# Patient Record
Sex: Male | Born: 1960
Health system: Southern US, Community
[De-identification: ages and names within clinical notes are randomized; demographics above are authoritative.]

## PROBLEM LIST (undated history)

## (undated) DIAGNOSIS — I10 Essential (primary) hypertension: Secondary | ICD-10-CM

## (undated) DIAGNOSIS — K219 Gastro-esophageal reflux disease without esophagitis: Secondary | ICD-10-CM

## (undated) DIAGNOSIS — C801 Malignant (primary) neoplasm, unspecified: Secondary | ICD-10-CM

## (undated) DIAGNOSIS — J189 Pneumonia, unspecified organism: Secondary | ICD-10-CM

## (undated) DIAGNOSIS — C61 Malignant neoplasm of prostate: Secondary | ICD-10-CM

## (undated) HISTORY — PX: OTHER SURGICAL HISTORY: SHX169

---

## 1998-08-30 ENCOUNTER — Ambulatory Visit (HOSPITAL_COMMUNITY): Admission: RE | Admit: 1998-08-30 | Discharge: 1998-08-30 | Payer: Self-pay | Admitting: Orthopaedic Surgery

## 1999-10-26 ENCOUNTER — Emergency Department (HOSPITAL_COMMUNITY): Admission: EM | Admit: 1999-10-26 | Discharge: 1999-10-26 | Payer: Self-pay | Admitting: *Deleted

## 1999-10-26 ENCOUNTER — Encounter: Payer: Self-pay | Admitting: *Deleted

## 1999-12-25 ENCOUNTER — Encounter: Payer: Self-pay | Admitting: Family Medicine

## 1999-12-25 ENCOUNTER — Encounter: Admission: RE | Admit: 1999-12-25 | Discharge: 1999-12-25 | Payer: Self-pay | Admitting: Family Medicine

## 2000-07-19 ENCOUNTER — Emergency Department (HOSPITAL_COMMUNITY): Admission: EM | Admit: 2000-07-19 | Discharge: 2000-07-19 | Payer: Self-pay | Admitting: Emergency Medicine

## 2004-04-29 ENCOUNTER — Emergency Department (HOSPITAL_COMMUNITY): Admission: EM | Admit: 2004-04-29 | Discharge: 2004-04-29 | Payer: Self-pay | Admitting: Emergency Medicine

## 2006-04-11 ENCOUNTER — Emergency Department (HOSPITAL_COMMUNITY): Admission: EM | Admit: 2006-04-11 | Discharge: 2006-04-11 | Payer: Self-pay | Admitting: Emergency Medicine

## 2007-04-19 ENCOUNTER — Emergency Department (HOSPITAL_COMMUNITY): Admission: EM | Admit: 2007-04-19 | Discharge: 2007-04-19 | Payer: Self-pay | Admitting: Emergency Medicine

## 2011-08-20 ENCOUNTER — Ambulatory Visit: Payer: Medicare HMO

## 2011-08-20 DIAGNOSIS — Z23 Encounter for immunization: Secondary | ICD-10-CM

## 2013-09-21 ENCOUNTER — Ambulatory Visit
Admission: RE | Admit: 2013-09-21 | Discharge: 2013-09-21 | Disposition: A | Discharge status: Home / Self Care | Payer: 59 | Source: Ambulatory Visit | Attending: Family Medicine | Admitting: Family Medicine

## 2013-09-21 ENCOUNTER — Other Ambulatory Visit: Payer: Self-pay | Admitting: Family Medicine

## 2013-09-21 DIAGNOSIS — R059 Cough, unspecified: Secondary | ICD-10-CM

## 2013-09-21 DIAGNOSIS — R05 Cough: Secondary | ICD-10-CM

## 2014-02-09 ENCOUNTER — Emergency Department (HOSPITAL_COMMUNITY)
Admission: EM | Admit: 2014-02-09 | Discharge: 2014-02-10 | Disposition: A | Discharge status: Home / Self Care | Payer: BC Managed Care – PPO | Attending: Emergency Medicine | Admitting: Emergency Medicine

## 2014-02-09 ENCOUNTER — Emergency Department (HOSPITAL_COMMUNITY): Payer: BC Managed Care – PPO

## 2014-02-09 ENCOUNTER — Encounter (HOSPITAL_COMMUNITY): Payer: Self-pay | Admitting: Emergency Medicine

## 2014-02-09 DIAGNOSIS — Z87828 Personal history of other (healed) physical injury and trauma: Secondary | ICD-10-CM | POA: Insufficient documentation

## 2014-02-09 DIAGNOSIS — I10 Essential (primary) hypertension: Secondary | ICD-10-CM | POA: Insufficient documentation

## 2014-02-09 DIAGNOSIS — M25579 Pain in unspecified ankle and joints of unspecified foot: Secondary | ICD-10-CM | POA: Insufficient documentation

## 2014-02-09 DIAGNOSIS — M79674 Pain in right toe(s): Secondary | ICD-10-CM

## 2014-02-09 HISTORY — DX: Essential (primary) hypertension: I10

## 2014-02-09 NOTE — ED Notes (Signed)
Pt to radiology.
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
## 2014-02-09 NOTE — ED Notes (Addendum)
Pt A+Ox4, reports c/o R great toe/joint pain x2 days, reports at onset "felt like a cramp", but reports worsening of pain since.  10/10.  Denies injuries.  Pt reports relieved with rest, worse with ambulation.  However at time also has pain at rest. No redness, swelling noted.  Ambulatory with steady gait.  Skin PWD.  Speaking full/clear sentences.  NAD.

## 2014-02-10 MED ORDER — INDOMETHACIN 25 MG PO CAPS: 25.0000 mg | ORAL_CAPSULE | Freq: Three times a day (TID) | ORAL | Status: DC | PRN

## 2014-02-10 MED ORDER — HYDROCODONE-ACETAMINOPHEN 5-325 MG PO TABS: 1.0000 | ORAL_TABLET | Freq: Four times a day (QID) | ORAL | Status: DC | PRN

## 2014-02-10 NOTE — Discharge Instructions (Signed)
Take Indomethacin as prescribed. Take Norco as needed for severe pain. Follow up with orthopedics.  Gout Gout is an inflammatory arthritis caused by a buildup of uric acid crystals in the joints. Uric acid is a chemical that is normally present in the blood. When the level of uric acid in the blood is too high it can form crystals that deposit in your joints and tissues. This causes joint redness, soreness, and swelling (inflammation). Repeat attacks are common. Over time, uric acid crystals can form into masses (tophi) near a joint, destroying bone and causing disfigurement. Gout is treatable and often preventable. CAUSES  The disease begins with elevated levels of uric acid in the blood. Uric acid is produced by your body when it breaks down a naturally found substance called purines. Certain foods you eat, such as meats and fish, contain high amounts of purines. Causes of an elevated uric acid level include:  Being passed down from parent to child (heredity).  Diseases that cause increased uric acid production (such as obesity, psoriasis, and certain cancers).  Excessive alcohol use.  Diet, especially diets rich in meat and seafood.  Medicines, including certain cancer-fighting medicines (chemotherapy), water pills (diuretics), and aspirin.  Chronic kidney disease. The kidneys are no longer able to remove uric acid well.  Problems with metabolism. Conditions strongly associated with gout include:  Obesity.  High blood pressure.  High cholesterol.  Diabetes. Not everyone with elevated uric acid levels gets gout. It is not understood why some people get gout and others do not. Surgery, joint injury, and eating too much of certain foods are some of the factors that can lead to gout attacks. SYMPTOMS   An attack of gout comes on quickly. It causes intense pain with redness, swelling, and warmth in a joint.  Fever can occur.  Often, only one joint is involved. Certain joints are  more commonly involved:  Base of the big toe.  Knee.  Ankle.  Wrist.  Finger. Without treatment, an attack usually goes away in a few days to weeks. Between attacks, you usually will not have symptoms, which is different from many other forms of arthritis. DIAGNOSIS  Your caregiver will suspect gout based on your symptoms and exam. In some cases, tests may be recommended. The tests may include:  Blood tests.  Urine tests.  X-rays.  Joint fluid exam. This exam requires a needle to remove fluid from the joint (arthrocentesis). Using a microscope, gout is confirmed when uric acid crystals are seen in the joint fluid. TREATMENT  There are two phases to gout treatment: treating the sudden onset (acute) attack and preventing attacks (prophylaxis).  Treatment of an Acute Attack.  Medicines are used. These include anti-inflammatory medicines or steroid medicines.  An injection of steroid medicine into the affected joint is sometimes necessary.  The painful joint is rested. Movement can worsen the arthritis.  You may use warm or cold treatments on painful joints, depending which works best for you.  Treatment to Prevent Attacks.  If you suffer from frequent gout attacks, your caregiver may advise preventive medicine. These medicines are started after the acute attack subsides. These medicines either help your kidneys eliminate uric acid from your body or decrease your uric acid production. You may need to stay on these medicines for a very long time.  The early phase of treatment with preventive medicine can be associated with an increase in acute gout attacks. For this reason, during the first few months of treatment, your caregiver  may also advise you to take medicines usually used for acute gout treatment. Be sure you understand your caregiver's directions. Your caregiver may make several adjustments to your medicine dose before these medicines are effective.  Discuss dietary  treatment with your caregiver or dietitian. Alcohol and drinks high in sugar and fructose and foods such as meat, poultry, and seafood can increase uric acid levels. Your caregiver or dietician can advise you on drinks and foods that should be limited. HOME CARE INSTRUCTIONS   Do not take aspirin to relieve pain. This raises uric acid levels.  Only take over-the-counter or prescription medicines for pain, discomfort, or fever as directed by your caregiver.  Rest the joint as much as possible. When in bed, keep sheets and blankets off painful areas.  Keep the affected joint raised (elevated).  Apply warm or cold treatments to painful joints. Use of warm or cold treatments depends on which works best for you.  Use crutches if the painful joint is in your leg.  Drink enough fluids to keep your urine clear or pale yellow. This helps your body get rid of uric acid. Limit alcohol, sugary drinks, and fructose drinks.  Follow your dietary instructions. Pay careful attention to the amount of protein you eat. Your daily diet should emphasize fruits, vegetables, whole grains, and fat-free or low-fat milk products. Discuss the use of coffee, vitamin C, and cherries with your caregiver or dietician. These may be helpful in lowering uric acid levels.  Maintain a healthy body weight. SEEK MEDICAL CARE IF:   You develop diarrhea, vomiting, or any side effects from medicines.  You do not feel better in 24 hours, or you are getting worse. SEEK IMMEDIATE MEDICAL CARE IF:   Your joint becomes suddenly more tender, and you have chills or a fever. MAKE SURE YOU:   Understand these instructions.  Will watch your condition.  Will get help right away if you are not doing well or get worse. Document Released: 08/16/2000 Document Revised: 12/14/2012 Document Reviewed: 04/01/2012 [facility] Patient Information 2014 [place].

## 2014-02-12 NOTE — ED Provider Notes (Signed)
Medical screening examination/treatment/procedure(s) were performed by non-physician practitioner and as supervising physician I was immediately available for consultation/collaboration.   EKG Interpretation None       Jasper Riling. Alvino Chapel, MD 02/12/14 [phone]

## 2016-09-23 ENCOUNTER — Other Ambulatory Visit: Payer: Self-pay | Admitting: Urology

## 2016-09-23 MED ORDER — MAGNESIUM CITRATE PO SOLN: 1.0000 | Freq: Once | ORAL | Status: AC

## 2016-09-23 MED ORDER — FLEET ENEMA 7-19 GM/118ML RE ENEM: 1.0000 | ENEMA | Freq: Once | RECTAL | Status: AC

## 2016-10-10 NOTE — Patient Instructions (Addendum)
Adam Sampson  10/10/2016   Your procedure is scheduled on: 10/24/16  Report to [facility]  Entrance take [facility]  elevators to 3rd floor to  [place] at    0930 AM.  Call this number if you have problems the morning of surgery [phone]   Remember: ONLY 1 PERSON MAY GO WITH YOU TO SHORT STAY TO GET  READY MORNING OF [place].  Do not eat food or drink liquids :After Midnight.     Take these medicines the morning of surgery with A SIP OF WATER: Nexium and flonase if needed               You may not have any metal on your body including hair pins and              piercings  Do not wear jewelry,lotions, powders or perfumes, deodorant                         Men may shave face and neck.   Do not bring valuables to the hospital. [place].  Contacts, dentures or bridgework may not be worn into surgery.  Leave suitcase in the car. After surgery it may be brought to your room.     Patients discharged the day of surgery will not be allowed to drive home.  Name and phone number of your driver:  Special Instructions: N/A              Please read over the following fact sheets you were given: _____________________________________________________________________             [facility] - Preparing for Surgery Before surgery, you can play an important role.  Because skin is not sterile, your skin needs to be as free of germs as possible.  You can reduce the number of germs on your skin by washing with CHG (chlorahexidine gluconate) soap before surgery.  CHG is an antiseptic cleaner which kills germs and bonds with the skin to continue killing germs even after washing. Please DO NOT use if you have an allergy to CHG or antibacterial soaps.  If your skin becomes reddened/irritated stop using the CHG and inform your nurse when you arrive at Short Stay. Do not shave (including legs and underarms) for at  least 48 hours prior to the first CHG shower.  You may shave your face/neck. Please follow these instructions carefully:  1.  Shower with CHG Soap the night before surgery and the  morning of Surgery.  2.  If you choose to wash your hair, wash your hair first as usual with your  normal  shampoo.  3.  After you shampoo, rinse your hair and body thoroughly to remove the  shampoo.                           4.  Use CHG as you would any other liquid soap.  You can apply chg directly  to the skin and wash                       Gently with a scrungie or clean washcloth.  5.  Apply the CHG Soap to your body  ONLY FROM THE NECK DOWN.   Do not use on face/ open                           Wound or open sores. Avoid contact with eyes, ears mouth and genitals (private parts).                       Wash face,  Genitals (private parts) with your normal soap.             6.  Wash thoroughly, paying special attention to the area where your surgery  will be performed.  7.  Thoroughly rinse your body with warm water from the neck down.  8.  DO NOT shower/wash with your normal soap after using and rinsing off  the CHG Soap.                9.  Pat yourself dry with a clean towel.            10.  Wear clean pajamas.            11.  Place clean sheets on your bed the night of your first shower and do not  sleep with pets. Day of Surgery : Do not apply any lotions/deodorants the morning of surgery.  Please wear clean clothes to the hospital/surgery center.  FAILURE TO FOLLOW THESE INSTRUCTIONS MAY RESULT IN THE CANCELLATION OF YOUR SURGERY PATIENT SIGNATURE_________________________________  NURSE SIGNATURE__________________________________  ________________________________________________________________________  WHAT IS A BLOOD TRANSFUSION? Blood Transfusion Information  A transfusion is the replacement of blood or some of its parts. Blood is made up of multiple cells which provide different functions.  Red  blood cells carry oxygen and are used for blood loss replacement.  White blood cells fight against infection.  Platelets control bleeding.  Plasma helps clot blood.  Other blood products are available for specialized needs, such as hemophilia or other clotting disorders. BEFORE THE TRANSFUSION  Who gives blood for transfusions?   Healthy volunteers who are fully evaluated to make sure their blood is safe. This is blood bank blood. Transfusion therapy is the safest it has ever been in the practice of medicine. Before blood is taken from a donor, a complete history is taken to make sure that person has no history of diseases nor engages in risky social behavior (examples are intravenous drug use or sexual activity with multiple partners). The donor's travel history is screened to minimize risk of transmitting infections, such as malaria. The donated blood is tested for signs of infectious diseases, such as HIV and hepatitis. The blood is then tested to be sure it is compatible with you in order to minimize the chance of a transfusion reaction. If you or a relative donates blood, this is often done in anticipation of surgery and is not appropriate for emergency situations. It takes many days to process the donated blood. RISKS AND COMPLICATIONS Although transfusion therapy is very safe and saves many lives, the main dangers of transfusion include:   Getting an infectious disease.  Developing a transfusion reaction. This is an allergic reaction to something in the blood you were given. Every precaution is taken to prevent this. The decision to have a blood transfusion has been considered carefully by your caregiver before blood is given. Blood is not given unless the benefits outweigh the risks. AFTER THE TRANSFUSION  Right after receiving a blood transfusion, you will usually feel much  better and more energetic. This is especially true if your red blood cells have gotten low (anemic). The  transfusion raises the level of the red blood cells which carry oxygen, and this usually causes an energy increase.  The nurse administering the transfusion will monitor you carefully for complications. HOME CARE INSTRUCTIONS  No special instructions are needed after a transfusion. You may find your energy is better. Speak with your caregiver about any limitations on activity for underlying diseases you may have. SEEK MEDICAL CARE IF:   Your condition is not improving after your transfusion.  You develop redness or irritation at the intravenous (IV) site. SEEK IMMEDIATE MEDICAL CARE IF:  Any of the following symptoms occur over the next 12 hours:  Shaking chills.  You have a temperature by mouth above 102 F (38.9 C), not controlled by medicine.  Chest, back, or muscle pain.  People around you feel you are not acting correctly or are confused.  Shortness of breath or difficulty breathing.  Dizziness and fainting.  You get a rash or develop hives.  You have a decrease in urine output.  Your urine turns a dark color or changes to pink, red, or brown. Any of the following symptoms occur over the next 10 days:  You have a temperature by mouth above 102 F (38.9 C), not controlled by medicine.  Shortness of breath.  Weakness after normal activity.  The white part of the eye turns yellow (jaundice).  You have a decrease in the amount of urine or are urinating less often.  Your urine turns a dark color or changes to pink, red, or brown. Document Released: 08/16/2000 Document Revised: 11/11/2011 Document Reviewed: 04/04/2008 [employer] Patient Information 2014 [place].  _______________________________________________________________________  Incentive Spirometer  An incentive spirometer is a tool that can help keep your lungs clear and active. This tool measures how well you are filling your lungs with each breath. Taking long deep breaths may help reverse or  decrease the chance of developing breathing (pulmonary) problems (especially infection) following:  A long period of time when you are unable to move or be active. BEFORE THE PROCEDURE   If the spirometer includes an indicator to show your best effort, your nurse or respiratory therapist will set it to a desired goal.  If possible, sit up straight or lean slightly forward. Try not to slouch.  Hold the incentive spirometer in an upright position. INSTRUCTIONS FOR USE  1. Sit on the edge of your bed if possible, or sit up as far as you can in bed or on a chair. 2. Hold the incentive spirometer in an upright position. 3. Breathe out normally. 4. Place the mouthpiece in your mouth and seal your lips tightly around it. 5. Breathe in slowly and as deeply as possible, raising the piston or the ball toward the top of the column. 6. Hold your breath for 3-5 seconds or for as long as possible. Allow the piston or ball to fall to the bottom of the column. 7. Remove the mouthpiece from your mouth and breathe out normally. 8. Rest for a few seconds and repeat Steps 1 through 7 at least 10 times every 1-2 hours when you are awake. Take your time and take a few normal breaths between deep breaths. 9. The spirometer may include an indicator to show your best effort. Use the indicator as a goal to work toward during each repetition. 10. After each set of 10 deep breaths, practice coughing to be sure  your lungs are clear. If you have an incision (the cut made at the time of surgery), support your incision when coughing by placing a pillow or rolled up towels firmly against it. Once you are able to get out of bed, walk around indoors and cough well. You may stop using the incentive spirometer when instructed by your caregiver.  RISKS AND COMPLICATIONS  Take your time so you do not get dizzy or light-headed.  If you are in pain, you may need to take or ask for pain medication before doing incentive spirometry.  It is harder to take a deep breath if you are having pain. AFTER USE  Rest and breathe slowly and easily.  It can be helpful to keep track of a log of your progress. Your caregiver can provide you with a simple table to help with this. If you are using the spirometer at home, follow these instructions: [place] IF:   You are having difficultly using the spirometer.  You have trouble using the spirometer as often as instructed.  Your pain medication is not giving enough relief while using the spirometer.  You develop fever of 100.5 F (38.1 C) or higher. SEEK IMMEDIATE MEDICAL CARE IF:   You cough up bloody sputum that had not been present before.  You develop fever of 102 F (38.9 C) or greater.  You develop worsening pain at or near the incision site. MAKE SURE YOU:   Understand these instructions.  Will watch your condition.  Will get help right away if you are not doing well or get worse. Document Released: 12/30/2006 Document Revised: 11/11/2011 Document Reviewed: 03/02/2007 [facility] Patient Information 2014 [place], [place].   ________________________________________________________________________

## 2016-10-14 ENCOUNTER — Encounter (HOSPITAL_COMMUNITY): Payer: Self-pay

## 2016-10-14 ENCOUNTER — Encounter (HOSPITAL_COMMUNITY)
Admission: RE | Admit: 2016-10-14 | Discharge: 2016-10-14 | Disposition: A | Discharge status: Home / Self Care | Payer: BLUE CROSS/BLUE SHIELD | Source: Ambulatory Visit | Attending: Urology | Admitting: Urology

## 2016-10-14 ENCOUNTER — Ambulatory Visit (HOSPITAL_COMMUNITY)
Admission: RE | Admit: 2016-10-14 | Discharge: 2016-10-14 | Disposition: A | Discharge status: Home / Self Care | Payer: BLUE CROSS/BLUE SHIELD | Source: Ambulatory Visit | Attending: Anesthesiology | Admitting: Anesthesiology

## 2016-10-14 DIAGNOSIS — C61 Malignant neoplasm of prostate: Secondary | ICD-10-CM | POA: Insufficient documentation

## 2016-10-14 DIAGNOSIS — Z01812 Encounter for preprocedural laboratory examination: Secondary | ICD-10-CM | POA: Insufficient documentation

## 2016-10-14 DIAGNOSIS — J984 Other disorders of lung: Secondary | ICD-10-CM | POA: Insufficient documentation

## 2016-10-14 DIAGNOSIS — Z01810 Encounter for preprocedural cardiovascular examination: Secondary | ICD-10-CM | POA: Diagnosis not present

## 2016-10-14 DIAGNOSIS — I517 Cardiomegaly: Secondary | ICD-10-CM | POA: Diagnosis not present

## 2016-10-14 DIAGNOSIS — Z01818 Encounter for other preprocedural examination: Secondary | ICD-10-CM

## 2016-10-14 HISTORY — DX: Malignant (primary) neoplasm, unspecified: C80.1

## 2016-10-14 HISTORY — DX: Gastro-esophageal reflux disease without esophagitis: K21.9

## 2016-10-14 HISTORY — DX: Pneumonia, unspecified organism: J18.9

## 2016-10-14 LAB — BASIC METABOLIC PANEL
Anion gap: 7 (ref 5–15)
BUN: 13 mg/dL (ref 6–20)
CALCIUM: 9.8 mg/dL (ref 8.9–10.3)
CHLORIDE: 105 mmol/L (ref 101–111)
CO2: 27 mmol/L (ref 22–32)
CREATININE: 1.21 mg/dL (ref 0.61–1.24)
GFR calc non Af Amer: >60 mL/min (ref >60)
GLUCOSE: 123 mg/dL — AB (ref 65–99)
Potassium: 4.3 mmol/L (ref 3.5–5.1)
Sodium: 139 mmol/L (ref 135–145)

## 2016-10-14 LAB — CBC
HCT: 45.5 % (ref 39.0–52.0)
Hemoglobin: 15.2 g/dL (ref 13.0–17.0)
MCH: 27.8 pg (ref 26.0–34.0)
MCHC: 33.4 g/dL (ref 30.0–36.0)
MCV: 83.2 fL (ref 78.0–100.0)
PLATELETS: 365 10*3/uL (ref 150–400)
RBC: 5.47 MIL/uL (ref 4.22–5.81)
RDW: 14.1 % (ref 11.5–15.5)
WBC: 9.6 10*3/uL (ref 4.0–10.5)

## 2016-10-14 LAB — ABO/RH: ABO/RH(D): A POS

## 2016-10-14 NOTE — Progress Notes (Signed)
Final Ekg  Epic

## 2016-10-23 NOTE — H&P (Signed)
CC/HPI: CC: Prostate Cancer     Adam Sampson is a 56 year old gentleman who was found to have an elevated PSA of 7.17 prompting a TRUS biopsy of the prostate on 07/10/16. This demonstrated Gleason 3+4=7 adenocarcinoma of the prostate with 9 out of 12 biopsy cores positive for malignancy.   Family history: None.   Imaging studies: None.   PMH: He has a history of GERD, gout, and hypertension. Ex-marine. Drives a Teacher, English as a foreign language.  PSH: No abdominal surgeries.   TNM stage: cT1c Nx Mx  PSA: 7.17  Gleason score: 3+4=7  Biopsy (07/10/16): 9/12 cores positive  Left: L lateral apex (50%, 3+3=6), L apex (20%, 3+3=6), L lateral mid (60%, 3+3=6, PNI), L mid (5%, 3+4=7, PNI)  Right: R apex (60%, 3+4=7, PNI), R lateral apex (50%, 3+3=6), R mid (10%, 3+3=6), R lateral mid (50%, 3+3=6), R lateral base (50%, 3+3=6)  Prostate volume: 32.4 cc   Nomogram  OC disease: 27%  EPE: 72%  SVI: 9%  LNI: 7%  PFS (5 year, 10 year): 80%,67%   Urinary function: IPSS is 9. He has urinary frequency and nocturia and attributes this to his hydration.  Erectile function: SHIM score is 24. He does not use medication.     ALLERGIES: None   MEDICATIONS: Nexium 20 mg capsule,delayed release  Amlodipine Besylate 5 mg tablet  Flonase Allergy Relief     Notes: PCP added another BP med on 09/13/16. PT is unsure of medication name. -aj,cma   GU PSH: Circumcision Prostate Needle Biopsy - 07/10/2016 Vasectomy      PSH Notes: rt knee athroscopy   NON-GU PSH: Surgical Pathology, Gross And Microscopic Examination For Prostate Needle - 07/10/2016    GU PMH: Prostate Cancer - 08/07/2016 Elevated PSA - 07/10/2016, - 05/24/2016      PMH Notes: GERD, Gout, hypertension   NON-GU PMH: None   FAMILY HISTORY: Alcoholism - Father ovarian cancer - Mother Seizure - Father    Notes: 2 sons 1 killed in car accident   SOCIAL HISTORY: Marital Status: Single Current Smoking Status: Patient has never smoked.  Has never drank.   Drinks 2 caffeinated drinks per day. Patient's occupation Animator.    REVIEW OF SYSTEMS:    GU Review Male:   Patient reports frequent urination and get up at night to urinate. Patient denies hard to postpone urination, burning/ pain with urination, leakage of urine, stream starts and stops, trouble starting your streams, and have to strain to urinate .  Gastrointestinal (Upper):   Patient denies nausea and vomiting.  Gastrointestinal (Lower):   Patient denies diarrhea and constipation.  Constitutional:   Patient reports fatigue. Patient denies fever, night sweats, and weight loss.  Skin:   Patient denies skin rash/ lesion and itching.  Eyes:   Patient denies blurred vision and double vision.  Ears/ Nose/ Throat:   Patient denies sore throat and sinus problems.  Hematologic/Lymphatic:   Patient denies swollen glands and easy bruising.  Cardiovascular:   Patient denies leg swelling and chest pains.  Respiratory:   Patient denies cough and shortness of breath.  Endocrine:   Patient denies excessive thirst.  Musculoskeletal:   Patient denies back pain and joint pain.  Neurological:   Patient denies headaches and dizziness.  Psychologic:   Patient denies anxiety and depression.   VITAL SIGNS:    Weight 232 lb / 105.23 kg  Height 68 in / 172.72 cm  Pulse 89 /min  BMI 35.3 kg/m  MULTI-SYSTEM PHYSICAL EXAMINATION:    Constitutional: Well-nourished. No physical deformities. Normally developed. Good grooming.  Neck: Neck symmetrical, not swollen. Normal tracheal position.  Respiratory: No labored breathing, no use of accessory muscles. Clear bilaterally.  Cardiovascular: Normal temperature, normal extremity pulses, no swelling, no varicosities. RRR.  Lymphatic: No enlargement of neck, axillae, groin.  Skin: No paleness, no jaundice, no cyanosis. No lesion, no ulcer, no rash.  Neurologic / Psychiatric: Oriented to time, oriented to place, oriented to person. No depression,  no anxiety, no agitation.  Gastrointestinal: No mass, no tenderness, no rigidity, non obese abdomen.  Eyes: Normal conjunctivae. Normal eyelids.  Ears, Nose, Mouth, and Throat: Left ear no scars, no lesions, no masses. Right ear no scars, no lesions, no masses. Nose no scars, no lesions, no masses. Normal hearing. Normal lips.  Musculoskeletal: Normal gait and station of head and neck.       ASSESSMENT:      ICD-10 Details  1 GU:   Prostate Cancer - C61    PLAN:        1. Prostate cancer:  He does wish to proceed with surgical treatment of his prostate cancer and feels well-informed. He will undergo a bilateral nerve sparing robot-assisted laparoscopic radical prostatectomy and bilateral pelvic lymphadenectomy.

## 2016-10-24 ENCOUNTER — Ambulatory Visit (HOSPITAL_COMMUNITY): Payer: BLUE CROSS/BLUE SHIELD | Admitting: Certified Registered"

## 2016-10-24 ENCOUNTER — Encounter (HOSPITAL_COMMUNITY)
Admission: RE | Disposition: A | Discharge status: Home / Self Care | Payer: Self-pay | Source: Ambulatory Visit | Attending: Urology

## 2016-10-24 ENCOUNTER — Observation Stay (HOSPITAL_COMMUNITY)
Admission: RE | Admit: 2016-10-24 | Discharge: 2016-10-25 | Disposition: A | Discharge status: Home / Self Care | Payer: BLUE CROSS/BLUE SHIELD | Source: Ambulatory Visit | Attending: Urology | Admitting: Urology

## 2016-10-24 ENCOUNTER — Encounter (HOSPITAL_COMMUNITY): Payer: Self-pay

## 2016-10-24 DIAGNOSIS — K219 Gastro-esophageal reflux disease without esophagitis: Secondary | ICD-10-CM | POA: Insufficient documentation

## 2016-10-24 DIAGNOSIS — I10 Essential (primary) hypertension: Secondary | ICD-10-CM | POA: Insufficient documentation

## 2016-10-24 DIAGNOSIS — M109 Gout, unspecified: Secondary | ICD-10-CM | POA: Insufficient documentation

## 2016-10-24 DIAGNOSIS — C61 Malignant neoplasm of prostate: Principal | ICD-10-CM | POA: Diagnosis present

## 2016-10-24 HISTORY — PX: LYMPHADENECTOMY: SHX5960

## 2016-10-24 HISTORY — PX: ROBOT ASSISTED LAPAROSCOPIC RADICAL PROSTATECTOMY: SHX5141

## 2016-10-24 LAB — TYPE AND SCREEN
ABO/RH(D): A POS
Antibody Screen: NEGATIVE

## 2016-10-24 LAB — HEMOGLOBIN AND HEMATOCRIT, BLOOD
HEMATOCRIT: 43.6 % (ref 39.0–52.0)
HEMOGLOBIN: 14.8 g/dL (ref 13.0–17.0)

## 2016-10-24 SURGERY — XI ROBOTIC ASSISTED LAPAROSCOPIC RADICAL PROSTATECTOMY LEVEL 2
Anesthesia: General

## 2016-10-24 MED ORDER — DIPHENHYDRAMINE HCL 12.5 MG/5ML PO ELIX: 12.5000 mg | ORAL_SOLUTION | Freq: Four times a day (QID) | ORAL | Status: DC | PRN

## 2016-10-24 MED ORDER — ONDANSETRON HCL 4 MG/2ML IJ SOLN
4.0000 mg | INTRAMUSCULAR | Status: DC | PRN
  Filled 2016-10-24: qty 2

## 2016-10-24 MED ORDER — HYDROMORPHONE HCL 2 MG/ML IJ SOLN
INTRAMUSCULAR | Status: AC
  Filled 2016-10-24: qty 1

## 2016-10-24 MED ORDER — MEPERIDINE HCL 50 MG/ML IJ SOLN: 6.2500 mg | INTRAMUSCULAR | Status: DC | PRN

## 2016-10-24 MED ORDER — DEXAMETHASONE SODIUM PHOSPHATE 10 MG/ML IJ SOLN
INTRAMUSCULAR | Status: AC
  Filled 2016-10-24: qty 1

## 2016-10-24 MED ORDER — FENTANYL CITRATE (PF) 100 MCG/2ML IJ SOLN
INTRAMUSCULAR | Status: DC | PRN
  Administered 2016-10-24: 50 ug via INTRAVENOUS
  Administered 2016-10-24: 100 ug via INTRAVENOUS
  Administered 2016-10-24 (×2): 50 ug via INTRAVENOUS

## 2016-10-24 MED ORDER — SUGAMMADEX SODIUM 500 MG/5ML IV SOLN
INTRAVENOUS | Status: AC
  Filled 2016-10-24: qty 5

## 2016-10-24 MED ORDER — SODIUM CHLORIDE 0.9 % IR SOLN
Status: DC | PRN
  Administered 2016-10-24: 1000 mL

## 2016-10-24 MED ORDER — LACTATED RINGERS IV SOLN
INTRAVENOUS | Status: DC
  Administered 2016-10-24: 1000 mL via INTRAVENOUS
  Administered 2016-10-24 (×2): via INTRAVENOUS

## 2016-10-24 MED ORDER — BUPIVACAINE HCL (PF) 0.25 % IJ SOLN
INTRAMUSCULAR | Status: AC
  Filled 2016-10-24: qty 30

## 2016-10-24 MED ORDER — BUPIVACAINE-EPINEPHRINE 0.25% -1:200000 IJ SOLN
INTRAMUSCULAR | Status: DC | PRN
  Administered 2016-10-24: 30 mL

## 2016-10-24 MED ORDER — PANTOPRAZOLE SODIUM 40 MG PO TBEC
40.0000 mg | DELAYED_RELEASE_TABLET | Freq: Every day | ORAL | Status: DC
  Administered 2016-10-25: 40 mg via ORAL
  Filled 2016-10-24: qty 1

## 2016-10-24 MED ORDER — DEXAMETHASONE SODIUM PHOSPHATE 10 MG/ML IJ SOLN
INTRAMUSCULAR | Status: DC | PRN
  Administered 2016-10-24: 10 mg via INTRAVENOUS

## 2016-10-24 MED ORDER — ONDANSETRON HCL 4 MG/2ML IJ SOLN
INTRAMUSCULAR | Status: AC
  Filled 2016-10-24: qty 2

## 2016-10-24 MED ORDER — KCL IN DEXTROSE-NACL 20-5-0.45 MEQ/L-%-% IV SOLN
INTRAVENOUS | Status: DC
  Administered 2016-10-24 – 2016-10-25 (×2): via INTRAVENOUS
  Filled 2016-10-24 (×3): qty 1000

## 2016-10-24 MED ORDER — DIPHENHYDRAMINE HCL 50 MG/ML IJ SOLN: 12.5000 mg | Freq: Four times a day (QID) | INTRAMUSCULAR | Status: DC | PRN

## 2016-10-24 MED ORDER — PROPOFOL 10 MG/ML IV BOLUS
INTRAVENOUS | Status: DC | PRN
  Administered 2016-10-24: 160 mg via INTRAVENOUS

## 2016-10-24 MED ORDER — ACETAMINOPHEN 325 MG PO TABS: 650.0000 mg | ORAL_TABLET | ORAL | Status: DC | PRN

## 2016-10-24 MED ORDER — PROPOFOL 10 MG/ML IV BOLUS
INTRAVENOUS | Status: AC
  Filled 2016-10-24: qty 20

## 2016-10-24 MED ORDER — HEPARIN SODIUM (PORCINE) 1000 UNIT/ML IJ SOLN
INTRAMUSCULAR | Status: AC
  Filled 2016-10-24: qty 1

## 2016-10-24 MED ORDER — SODIUM CHLORIDE 0.9 % IV BOLUS (SEPSIS)
1000.0000 mL | Freq: Once | INTRAVENOUS | Status: AC
  Administered 2016-10-24: 1000 mL via INTRAVENOUS

## 2016-10-24 MED ORDER — NEBIVOLOL HCL 5 MG PO TABS
5.0000 mg | ORAL_TABLET | Freq: Every day | ORAL | Status: DC
  Administered 2016-10-24: 5 mg via ORAL
  Filled 2016-10-24: qty 1

## 2016-10-24 MED ORDER — LACTATED RINGERS IV SOLN
INTRAVENOUS | Status: DC | PRN
  Administered 2016-10-24: 13:00:00

## 2016-10-24 MED ORDER — ROCURONIUM BROMIDE 10 MG/ML (PF) SYRINGE
PREFILLED_SYRINGE | INTRAVENOUS | Status: DC | PRN
  Administered 2016-10-24: 20 mg via INTRAVENOUS
  Administered 2016-10-24: 50 mg via INTRAVENOUS
  Administered 2016-10-24: 10 mg via INTRAVENOUS

## 2016-10-24 MED ORDER — HYDROMORPHONE HCL 1 MG/ML IJ SOLN
INTRAMUSCULAR | Status: DC | PRN
  Administered 2016-10-24: 0.5 mg via INTRAVENOUS
  Administered 2016-10-24: 1 mg via INTRAVENOUS
  Administered 2016-10-24: 0.5 mg via INTRAVENOUS

## 2016-10-24 MED ORDER — CEFAZOLIN IN D5W 1 GM/50ML IV SOLN
1.0000 g | Freq: Three times a day (TID) | INTRAVENOUS | Status: AC
  Administered 2016-10-24 – 2016-10-25 (×2): 1 g via INTRAVENOUS
  Filled 2016-10-24 (×2): qty 50

## 2016-10-24 MED ORDER — MIDAZOLAM HCL 2 MG/2ML IJ SOLN
INTRAMUSCULAR | Status: AC
  Filled 2016-10-24: qty 2

## 2016-10-24 MED ORDER — FLUTICASONE PROPIONATE 50 MCG/ACT NA SUSP: 1.0000 | Freq: Every day | NASAL | Status: DC | PRN

## 2016-10-24 MED ORDER — SUGAMMADEX SODIUM 200 MG/2ML IV SOLN
INTRAVENOUS | Status: DC | PRN
  Administered 2016-10-24: 150 mg via INTRAVENOUS

## 2016-10-24 MED ORDER — HYDROCODONE-ACETAMINOPHEN 5-325 MG PO TABS: 1.0000 | ORAL_TABLET | Freq: Four times a day (QID) | ORAL | 0 refills | Status: DC | PRN

## 2016-10-24 MED ORDER — ROCURONIUM BROMIDE 50 MG/5ML IV SOSY
PREFILLED_SYRINGE | INTRAVENOUS | Status: AC
  Filled 2016-10-24: qty 5

## 2016-10-24 MED ORDER — KETOROLAC TROMETHAMINE 15 MG/ML IJ SOLN
15.0000 mg | Freq: Four times a day (QID) | INTRAMUSCULAR | Status: DC
  Administered 2016-10-24 – 2016-10-25 (×3): 15 mg via INTRAVENOUS
  Filled 2016-10-24 (×4): qty 1

## 2016-10-24 MED ORDER — MIDAZOLAM HCL 5 MG/5ML IJ SOLN
INTRAMUSCULAR | Status: DC | PRN
  Administered 2016-10-24: 2 mg via INTRAVENOUS

## 2016-10-24 MED ORDER — BUPIVACAINE HCL (PF) 0.5 % IJ SOLN
INTRAMUSCULAR | Status: AC
  Filled 2016-10-24: qty 30

## 2016-10-24 MED ORDER — MORPHINE SULFATE (PF) 4 MG/ML IV SOLN
2.0000 mg | INTRAVENOUS | Status: DC | PRN
  Administered 2016-10-24 – 2016-10-25 (×2): 4 mg via INTRAVENOUS
  Filled 2016-10-24 (×2): qty 1

## 2016-10-24 MED ORDER — FENTANYL CITRATE (PF) 250 MCG/5ML IJ SOLN
INTRAMUSCULAR | Status: AC
  Filled 2016-10-24: qty 5

## 2016-10-24 MED ORDER — LIDOCAINE 2% (20 MG/ML) 5 ML SYRINGE
INTRAMUSCULAR | Status: DC | PRN
  Administered 2016-10-24: 100 mg via INTRAVENOUS

## 2016-10-24 MED ORDER — ONDANSETRON HCL 4 MG/2ML IJ SOLN
INTRAMUSCULAR | Status: DC | PRN
  Administered 2016-10-24: 4 mg via INTRAVENOUS

## 2016-10-24 MED ORDER — HYDROMORPHONE HCL 1 MG/ML IJ SOLN
0.2500 mg | INTRAMUSCULAR | Status: DC | PRN
  Administered 2016-10-24 (×4): 0.5 mg via INTRAVENOUS

## 2016-10-24 MED ORDER — DOCUSATE SODIUM 100 MG PO CAPS
100.0000 mg | ORAL_CAPSULE | Freq: Two times a day (BID) | ORAL | Status: DC
  Administered 2016-10-24 – 2016-10-25 (×2): 100 mg via ORAL
  Filled 2016-10-24 (×2): qty 1

## 2016-10-24 MED ORDER — SULFAMETHOXAZOLE-TRIMETHOPRIM 800-160 MG PO TABS: 1.0000 | ORAL_TABLET | Freq: Two times a day (BID) | ORAL | 0 refills | Status: DC

## 2016-10-24 MED ORDER — LIDOCAINE 2% (20 MG/ML) 5 ML SYRINGE
INTRAMUSCULAR | Status: AC
  Filled 2016-10-24: qty 5

## 2016-10-24 MED ORDER — ONDANSETRON HCL 4 MG/2ML IJ SOLN: 4.0000 mg | Freq: Once | INTRAMUSCULAR | Status: DC | PRN

## 2016-10-24 MED ORDER — CEFAZOLIN SODIUM-DEXTROSE 2-4 GM/100ML-% IV SOLN
2.0000 g | INTRAVENOUS | Status: AC
  Administered 2016-10-24: 2 g via INTRAVENOUS
  Filled 2016-10-24: qty 100

## 2016-10-24 SURGICAL SUPPLY — 55 items
ADH SKN CLS APL DERMABOND .7 (GAUZE/BANDAGES/DRESSINGS) ×2
APPLICATOR COTTON TIP 6IN STRL (MISCELLANEOUS) ×4 IMPLANT
CATH FOLEY 2WAY SLVR 18FR 30CC (CATHETERS) ×4 IMPLANT
CATH ROBINSON RED A/P 16FR (CATHETERS) ×4 IMPLANT
CATH ROBINSON RED A/P 8FR (CATHETERS) ×4 IMPLANT
CATH TIEMANN FOLEY 18FR 5CC (CATHETERS) ×4 IMPLANT
CHLORAPREP W/TINT 26ML (MISCELLANEOUS) ×4 IMPLANT
CLIP LIGATING HEM O LOK PURPLE (MISCELLANEOUS) ×8 IMPLANT
COVER SURGICAL LIGHT HANDLE (MISCELLANEOUS) ×4 IMPLANT
COVER TIP SHEARS 8 DVNC (MISCELLANEOUS) ×2 IMPLANT
COVER TIP SHEARS 8MM DA VINCI (MISCELLANEOUS) ×2
CUTTER ECHEON FLEX ENDO 45 340 (ENDOMECHANICALS) ×4 IMPLANT
DECANTER SPIKE VIAL GLASS SM (MISCELLANEOUS) ×4 IMPLANT
DERMABOND ADVANCED (GAUZE/BANDAGES/DRESSINGS) ×2
DERMABOND ADVANCED .7 DNX12 (GAUZE/BANDAGES/DRESSINGS) ×2 IMPLANT
DRAPE ARM DVNC X/XI (DISPOSABLE) ×8 IMPLANT
DRAPE COLUMN DVNC XI (DISPOSABLE) ×2 IMPLANT
DRAPE DA VINCI XI ARM (DISPOSABLE) ×8
DRAPE DA VINCI XI COLUMN (DISPOSABLE) ×2
DRAPE SURG IRRIG POUCH 19X23 (DRAPES) ×4 IMPLANT
DRSG TEGADERM 4X4.75 (GAUZE/BANDAGES/DRESSINGS) ×4 IMPLANT
ELECT REM PT RETURN 9FT ADLT (ELECTROSURGICAL) ×4
ELECTRODE REM PT RTRN 9FT ADLT (ELECTROSURGICAL) ×2 IMPLANT
GLOVE BIO SURGEON STRL SZ 6.5 (GLOVE) ×3 IMPLANT
GLOVE BIO SURGEONS STRL SZ 6.5 (GLOVE) ×1
GLOVE BIOGEL M STRL SZ7.5 (GLOVE) ×12 IMPLANT
GOWN STRL REUS W/TWL LRG LVL3 (GOWN DISPOSABLE) ×16 IMPLANT
HOLDER FOLEY CATH W/STRAP (MISCELLANEOUS) ×4 IMPLANT
IRRIG SUCT STRYKERFLOW 2 WTIP (MISCELLANEOUS) ×4
IRRIGATION SUCT STRKRFLW 2 WTP (MISCELLANEOUS) ×2 IMPLANT
IV LACTATED RINGERS 1000ML (IV SOLUTION) IMPLANT
NDL SAFETY ECLIPSE 18X1.5 (NEEDLE) ×2 IMPLANT
NEEDLE HYPO 18GX1.5 SHARP (NEEDLE) ×3
PACK ROBOT UROLOGY CUSTOM (CUSTOM PROCEDURE TRAY) ×4 IMPLANT
RELOAD GREEN ECHELON 45 (STAPLE) ×4 IMPLANT
SEAL CANN UNIV 5-8 DVNC XI (MISCELLANEOUS) ×8 IMPLANT
SEAL XI 5MM-8MM UNIVERSAL (MISCELLANEOUS) ×8
SLEEVE SURGEON STRL (DRAPES) ×4 IMPLANT
SOLUTION ELECTROLUBE (MISCELLANEOUS) ×4 IMPLANT
SUT ETHILON 3 0 PS 1 (SUTURE) ×4 IMPLANT
SUT MNCRL 3 0 RB1 (SUTURE) ×2 IMPLANT
SUT MNCRL 3 0 VIOLET RB1 (SUTURE) ×2 IMPLANT
SUT MNCRL AB 4-0 PS2 18 (SUTURE) ×8 IMPLANT
SUT MONOCRYL 3 0 RB1 (SUTURE) ×4
SUT VIC AB 0 CT1 27 (SUTURE) ×4
SUT VIC AB 0 CT1 27XBRD ANTBC (SUTURE) ×2 IMPLANT
SUT VIC AB 0 UR5 27 (SUTURE) ×4 IMPLANT
SUT VIC AB 2-0 SH 27 (SUTURE) ×4
SUT VIC AB 2-0 SH 27X BRD (SUTURE) ×2 IMPLANT
SUT VICRYL 0 UR6 27IN ABS (SUTURE) ×8 IMPLANT
SYR 27GX1/2 1ML LL SAFETY (SYRINGE) ×4 IMPLANT
TOWEL OR 17X26 10 PK STRL BLUE (TOWEL DISPOSABLE) ×4 IMPLANT
TOWEL OR NON WOVEN STRL DISP B (DISPOSABLE) ×4 IMPLANT
TUBING INSUFFLATION 10FT LAP (TUBING) IMPLANT
WATER STERILE IRR 1500ML POUR (IV SOLUTION) IMPLANT

## 2016-10-24 NOTE — Progress Notes (Signed)
Hgb. And Hct. Drawn by lab. 

## 2016-10-24 NOTE — Transfer of Care (Signed)
Immediate Anesthesia Transfer of Care Note  Patient: Adam Sampson  Procedure(s) Performed: Procedure(s): XI ROBOTIC ASSISTED LAPAROSCOPIC RADICAL PROSTATECTOMY LEVEL 2 (N/A) PELVIC LYMPHADENECTOMY (Bilateral)  Patient Location: PACU  Anesthesia Type:General  Level of Consciousness: awake, alert  and oriented  Airway & Oxygen Therapy: Patient Spontanous Breathing and Patient connected to face mask oxygen  Post-op Assessment: Report given to RN and Post -op Vital signs reviewed and stable  Post vital signs: Reviewed and stable  Last Vitals:  Vitals:   10/24/16 0925  BP: (!) 146/86  Pulse: 74  Resp: 16  Temp: 36.9 C    Last Pain:  Vitals:   10/24/16 0925  TempSrc: Oral      Patients Stated Pain Goal: 5 ([id number])  Complications: No apparent anesthesia complications

## 2016-10-24 NOTE — Anesthesia Preprocedure Evaluation (Signed)
Anesthesia Evaluation  Patient identified by MRN, date of birth, ID band Patient awake    Reviewed: Allergy & Precautions, NPO status , Patient's Chart, lab work & pertinent test results  Airway Mallampati: I  TM Distance: >3 FB Neck ROM: Full    Dental   Pulmonary    Pulmonary exam normal       Cardiovascular hypertension, Pt. on medications Normal cardiovascular exam    Neuro/Psych    GI/Hepatic GERD-  Medicated and Controlled,  Endo/Other    Renal/GU      Musculoskeletal   Abdominal   Peds  Hematology   Anesthesia Other Findings   Reproductive/Obstetrics                             Anesthesia Physical Anesthesia Plan  ASA: II  Anesthesia Plan: General   Post-op Pain Management:    Induction: Intravenous  Airway Management Planned: Oral ETT  Additional Equipment:   Intra-op Plan:   Post-operative Plan: Extubation in OR  Informed Consent: I have reviewed the patients History and Physical, chart, labs and discussed the procedure including the risks, benefits and alternatives for the proposed anesthesia with the patient or authorized representative who has indicated his/her understanding and acceptance.     Plan Discussed with: CRNA and Surgeon  Anesthesia Plan Comments:         Anesthesia Quick Evaluation  

## 2016-10-24 NOTE — Anesthesia Postprocedure Evaluation (Signed)
Anesthesia Post Note  Patient: Adam Sampson  Procedure(s) Performed: Procedure(s) (LRB): XI ROBOTIC ASSISTED LAPAROSCOPIC RADICAL PROSTATECTOMY LEVEL 2 (N/A) PELVIC LYMPHADENECTOMY (Bilateral)  Patient location during evaluation: PACU Anesthesia Type: General Level of consciousness: awake and alert Pain management: pain level controlled Vital Signs Assessment: post-procedure vital signs reviewed and stable Respiratory status: spontaneous breathing, nonlabored ventilation, respiratory function stable and patient connected to nasal cannula oxygen Cardiovascular status: blood pressure returned to baseline and stable Postop Assessment: no signs of nausea or vomiting Anesthetic complications: no Comments: Oxygen saturation in high 80's on [place] O2. Will have continuous pulse oximetry overnight       Last Vitals:  Vitals:   10/24/16 1615 10/24/16 1628  BP: (!) 172/89   Pulse: 96 96  Resp: 17 15  Temp:      Last Pain:  Vitals:   10/24/16 1628  TempSrc:   PainSc: 5                  Story Conti S

## 2016-10-24 NOTE — Anesthesia Procedure Notes (Signed)
Procedure Name: Intubation Date/Time: 10/24/2016 12:11 PM Performed by: Noralyn Pick D Pre-anesthesia Checklist: Patient identified, Emergency Drugs available, Suction available and Patient being monitored Patient Re-evaluated:Patient Re-evaluated prior to inductionOxygen Delivery Method: Circle system utilized Preoxygenation: Pre-oxygenation with 100% oxygen Intubation Type: IV induction Ventilation: Mask ventilation without difficulty Laryngoscope Size: Mac and 4 Grade View: Grade II Tube type: Oral Tube size: 7.5 mm Number of attempts: 1 Airway Equipment and Method: Stylet and Oral airway Placement Confirmation: ETT inserted through vocal cords under direct vision,  positive ETCO2 and breath sounds checked- equal and bilateral Secured at: 23 cm Tube secured with: Tape Dental Injury: Teeth and Oropharynx as per pre-operative assessment

## 2016-10-24 NOTE — Progress Notes (Signed)
Hgb.- 14.8- Hct. 43.6- lab results noted

## 2016-10-24 NOTE — Progress Notes (Signed)
Dr. Kalman Shan made aware of patient's blood pressures

## 2016-10-24 NOTE — Discharge Instructions (Signed)

## 2016-10-24 NOTE — Progress Notes (Signed)
Patient ID: Adam Sampson, male   DOB: 01-Mar-1961, 56 y.o.   MRN: [id number]  Post-op note  Subjective: The patient is doing well.  No complaints.  Objective: Vital signs in last 24 hours: Temp:  [98.1 F (36.7 C)-98.8 F (37.1 C)] 98.1 F (36.7 C) (02/22 1745) Pulse Rate:  [74-102] 102 (02/22 1745) Resp:  [12-25] 12 (02/22 1745) BP: (137-180)/(86-100) 167/92 (02/22 1745) SpO2:  [93 %-100 %] 97 % (02/22 1745) Weight:  [109.8 kg (242 lb)] 109.8 kg (242 lb) (02/22 1001)  Intake/Output from previous day: No intake/output data recorded. Intake/Output this shift: Total I/O In: 3110 [I.V.:2110; IV Piggyback:1000] Out: 245 [Urine:100; Drains:45; Blood:100]  Physical Exam:  General: Alert and oriented. Abdomen: Soft, Nondistended. Incisions: Clean and dry. GU: Urine clear.  Lab Results:  Recent Labs  10/24/16 1614  HGB 14.8  HCT 43.6    Assessment/Plan: POD#0   1) Continue to monitor, ambulate, IS   [name]. MD   LOS: 0 days   Arrow Emmerich,LES 10/24/2016, 6:28 PM

## 2016-10-24 NOTE — Op Note (Signed)
Preoperative diagnosis: Clinically localized adenocarcinoma of the prostate (clinical stage cT1c Nx Mx)  Postoperative diagnosis: Clinically localized adenocarcinoma of the prostate (clinical stage cT1c Nx Mx)  Procedure:  1. Robotic assisted laparoscopic radical prostatectomy (bilateral nerve sparing) 2. Bilateral robotic assisted laparoscopic pelvic lymphadenectomy  Surgeon: [name]. M.D.  Assistant:  Debbrah Alar, PA-C  An assistant was required for this surgical procedure.  The duties of the assistant included but were not limited to suctioning, passing suture, camera manipulation, retraction. This procedure would not be able to be performed without an Environmental consultant.  Anesthesia: General  Complications: None  EBL: 100 mL  IVF:  2000 mL crystalloid  Specimens: 1. Prostate and seminal vesicles 2. Right pelvic lymph nodes 3. Left pelvic lymph nodes  Disposition of specimens: Pathology  Drains: 1. 20 Fr coude catheter 2. # 19 Blake pelvic drain  Indication: Adam Sampson is a 56 y.o. year old patient with clinically localized prostate cancer.  After a thorough review of the management options for treatment of prostate cancer, he elected to proceed with surgical therapy and the above procedure(s).  We have discussed the potential benefits and risks of the procedure, side effects of the proposed treatment, the likelihood of the patient achieving the goals of the procedure, and any potential problems that might occur during the procedure or recuperation. Informed consent has been obtained.  Description of procedure:  The patient was taken to the operating room and a general anesthetic was administered. He was given preoperative antibiotics, placed in the dorsal lithotomy position, and prepped and draped in the usual sterile fashion. Next a preoperative timeout was performed. A urethral catheter was placed into the bladder and a site was selected near the umbilicus for  placement of the camera port. This was placed using a standard open Hassan technique which allowed entry into the peritoneal cavity under direct vision and without difficulty. An 8 mm robotic port was placed and a pneumoperitoneum established. The camera was then used to inspect the abdomen and there was no evidence of any intra-abdominal injuries or other abnormalities. The remaining abdominal ports were then placed. 8 mm robotic ports were placed in the right lower quadrant, left lower quadrant, and far left lateral abdominal wall. A 5 mm port was placed in the right upper quadrant and a 12 mm port was placed in the right lateral abdominal wall for laparoscopic assistance. All ports were placed under direct vision without difficulty. The surgical cart was then docked.   Utilizing the cautery scissors, the bladder was reflected posteriorly allowing entry into the space of Retzius and identification of the endopelvic fascia and prostate. The periprostatic fat was then removed from the prostate allowing full exposure of the endopelvic fascia. The endopelvic fascia was then incised from the apex back to the base of the prostate bilaterally and the underlying levator muscle fibers were swept laterally off the prostate thereby isolating the dorsal venous complex. The dorsal vein was then stapled and divided with a 45 mm Flex Echelon stapler. Attention then turned to the bladder neck which was divided anteriorly thereby allowing entry into the bladder and exposure of the urethral catheter. The catheter balloon was deflated and the catheter was brought into the operative field and used to retract the prostate anteriorly. The posterior bladder neck was then examined and was divided allowing further dissection between the bladder and prostate posteriorly until the vasa deferentia and seminal vessels were identified. The vasa deferentia were isolated, divided, and  lifted anteriorly. The seminal vesicles were dissected down  to their tips with care to control the seminal vascular arterial blood supply. These structures were then lifted anteriorly and the space between Denonvillier's fascia and the anterior rectum was developed with a combination of sharp and blunt dissection. This isolated the vascular pedicles of the prostate.  The lateral prostatic fascia was then sharply incised allowing release of the neurovascular bundles bilaterally. The vascular pedicles of the prostate were then ligated with Weck clips between the prostate and neurovascular bundles and divided with sharp cold scissor dissection resulting in neurovascular bundle preservation. The neurovascular bundles were then separated off the apex of the prostate and urethra bilaterally.  The urethra was then sharply transected allowing the prostate specimen to be disarticulated. The pelvis was copiously irrigated and hemostasis was ensured. There was no evidence for rectal injury.  Attention then turned to the right pelvic sidewall. The fibrofatty tissue between the external iliac vein, confluence of the iliac vessels, hypogastric artery, and Cooper's ligament was dissected free from the pelvic sidewall with care to preserve the obturator nerve. Weck clips were used for lymphostasis and hemostasis. An identical procedure was performed on the contralateral side and the lymphatic packets were removed for permanent pathologic analysis.  Attention then turned to the urethral anastomosis. A 2-0 Vicryl slip knot was placed between Denonvillier's fascia, the posterior bladder neck, and the posterior urethra to reapproximate these structures. A double-armed 3-0 Monocryl suture was then used to perform a 360 running tension-free anastomosis between the bladder neck and urethra. A new urethral catheter was then placed into the bladder and irrigated. There were no blood clots within the bladder and the anastomosis appeared to be watertight. A #19 Blake drain was then brought  through the left lateral 8 mm port site and positioned appropriately within the pelvis. It was secured to the skin with a nylon suture. The surgical cart was then undocked. The right lateral 12 mm port site was closed at the fascial level with a 0 Vicryl suture placed laparoscopically. All remaining ports were then removed under direct vision. The prostate specimen was removed intact within the Endopouch retrieval bag via the periumbilical camera port site. This fascial opening was closed with two running 0 Vicryl sutures. 0.25% Marcaine was then injected into all port sites and all incisions were reapproximated at the skin level with 4-0 Monocryl subcuticular sutures and Liquiband. The patient appeared to tolerate the procedure well and without complications. The patient was able to be extubated and transferred to the recovery unit in satisfactory condition.   [name] MD

## 2016-10-24 NOTE — Progress Notes (Signed)
Pt. States used mag citrate and fleets enema at home as instructed with good results.

## 2016-10-25 ENCOUNTER — Encounter (HOSPITAL_COMMUNITY): Payer: Self-pay | Admitting: *Deleted

## 2016-10-25 DIAGNOSIS — C61 Malignant neoplasm of prostate: Secondary | ICD-10-CM | POA: Diagnosis not present

## 2016-10-25 LAB — HEMOGLOBIN AND HEMATOCRIT, BLOOD
HCT: 39.2 % (ref 39.0–52.0)
Hemoglobin: 13.5 g/dL (ref 13.0–17.0)

## 2016-10-25 MED ORDER — BISACODYL 10 MG RE SUPP
10.0000 mg | Freq: Once | RECTAL | Status: AC
  Administered 2016-10-25: 10 mg via RECTAL
  Filled 2016-10-25: qty 1

## 2016-10-25 MED ORDER — HYDROCODONE-ACETAMINOPHEN 5-325 MG PO TABS
1.0000 | ORAL_TABLET | ORAL | Status: DC | PRN
  Administered 2016-10-25: 1 via ORAL
  Filled 2016-10-25: qty 1

## 2016-10-25 MED FILL — HYDROCODON-APAP 5-325: 5-325 | 3 days supply | Qty: 30 | Fill #0

## 2016-10-25 MED FILL — SULFAMETHOXAZOLE/TMP DS TAB: 800-160 | 3 days supply | Qty: 6 | Fill #0

## 2016-10-25 NOTE — Discharge Summary (Signed)
Date of admission: 10/24/2016  Date of discharge: 10/25/2016  Admission diagnosis: Prostate Cancer  Discharge diagnosis: Prostate Cancer  History and Physical: For full details, please see admission history and physical. Briefly, Adam Sampson is a 56 y.o. gentleman with localized prostate cancer.  After discussing management/treatment options, he elected to proceed with surgical treatment.  Hospital Course: MIRO BALDERSON was taken to the operating room on 10/24/2016 and underwent a robotic assisted laparoscopic radical prostatectomy. He tolerated this procedure well and without complications. Postoperatively, he was able to be transferred to a regular hospital room following recovery from anesthesia.  He was able to begin ambulating the night of surgery. He remained hemodynamically stable overnight.  He had excellent urine output with appropriately minimal output from his pelvic drain and his pelvic drain was removed on POD #1.  He was transitioned to oral pain medication, tolerated a clear liquid diet, and had met all discharge criteria and was able to be discharged home later on POD#1.  Laboratory values:  Recent Labs  10/24/16 1614 10/25/16 0533  HGB 14.8 13.5  HCT 43.6 39.2    Disposition: Home  Discharge instruction: He was instructed to be ambulatory but to refrain from heavy lifting, strenuous activity, or driving. He was instructed on urethral catheter care.  Discharge medications:  Allergies as of 10/25/2016   No Known Allergies     Medication List    STOP taking these medications   ALEVE 220 MG tablet Generic drug:  naproxen sodium     TAKE these medications   amLODipine 5 MG tablet Commonly known as:  NORVASC Take 1 tablet by mouth at bedtime.   esomeprazole 20 MG capsule Commonly known as:  NEXIUM Take 20 mg by mouth daily.   fluticasone 50 MCG/ACT nasal spray Commonly known as:  FLONASE Place 1 spray into both nostrils daily as needed for allergies or  rhinitis.   HYDROcodone-acetaminophen 5-325 MG tablet Commonly known as:  NORCO Take 1-2 tablets by mouth every 6 (six) hours as needed for moderate pain or severe pain.   nebivolol 5 MG tablet Commonly known as:  BYSTOLIC Take 5 mg by mouth at bedtime.   sulfamethoxazole-trimethoprim 800-160 MG tablet Commonly known as:  BACTRIM DS,SEPTRA DS Take 1 tablet by mouth 2 (two) times daily. Start the day prior to foley removal appointment       Followup: He will followup in 1 week for catheter removal and to discuss his surgical pathology results.

## 2016-10-25 NOTE — Addendum Note (Signed)
Addendum  created 10/25/16 1432 by Marijo Conception, CRNA   Anesthesia Intra Meds edited

## 2016-10-25 NOTE — Progress Notes (Signed)
1 Day Post-Op Subjective: The patient is doing well.  No nausea or vomiting. Pain is adequately controlled. Minimal drain output (38ml over last 5 hours). Ambulated x 2, no flatus yet. Labs appropriate.  Objective: Vital signs in last 24 hours: Temp:  [98.1 F (36.7 C)-99.3 F (37.4 C)] 99.2 F (37.3 C) (02/23 0652) Pulse Rate:  [74-109] 89 (02/23 0652) Resp:  [12-25] 18 (02/23 0652) BP: (137-180)/(78-100) 141/83 (02/23 0652) SpO2:  [92 %-100 %] 95 % (02/23 0652) Weight:  [109.8 kg (242 lb)] 109.8 kg (242 lb) (02/22 1001)  Intake/Output from previous day: 02/22 0701 - 02/23 0700 In: 5422.5 [P.O.:600; I.V.:3772.5; IV Piggyback:1050] Out: 2300 [Urine:2100; Drains:100; Blood:100] Intake/Output this shift: No intake/output data recorded.  Physical Exam:  General: Alert and oriented. CV: RRR Lungs: Clear bilaterally. GI: Soft, Nondistended. Incisions: Clean, dry, and intact Urine: Barely blood tinged in tubing Extremities: Nontender, no erythema, no edema.  Lab Results:  Recent Labs  10/24/16 1614 10/25/16 0533  HGB 14.8 13.5  HCT 43.6 39.2      Assessment/Plan: POD# 1 s/p robotic prostatectomy.  1) SL IVF 2) Ambulate, Incentive spirometry 3) Transition to oral pain medication 4) Dulcolax suppository 5) D/C pelvic drain 6) Plan for likely discharge later today     LOS: 0 days   Adam Sampson 10/25/2016, 7:03 AM

## 2016-10-29 ENCOUNTER — Encounter (HOSPITAL_COMMUNITY): Payer: Self-pay | Admitting: Urology

## 2017-02-03 NOTE — Addendum Note (Signed)
Addendum  created 02/03/17 1126 by Myrtie Soman, MD   Sign clinical note

## 2017-02-03 NOTE — Anesthesia Postprocedure Evaluation (Signed)
Anesthesia Post Note  Patient: Adam Sampson  Procedure(s) Performed: Procedure(s) (LRB): XI ROBOTIC ASSISTED LAPAROSCOPIC RADICAL PROSTATECTOMY LEVEL 2 (N/A) PELVIC LYMPHADENECTOMY (Bilateral)     Anesthesia Post Evaluation  Last Vitals:  Vitals:   10/25/16 0019 10/25/16 0652  BP: (!) 156/78 (!) 141/83  Pulse: (!) 106 89  Resp: 16 18  Temp: 37.4 C 37.3 C    Last Pain:  Vitals:   10/25/16 0920  TempSrc:   PainSc: Asleep                 Melessia Kaus S

## 2019-04-01 ENCOUNTER — Other Ambulatory Visit: Payer: Self-pay | Admitting: Family Medicine

## 2019-04-01 ENCOUNTER — Ambulatory Visit
Admission: RE | Admit: 2019-04-01 | Discharge: 2019-04-01 | Disposition: A | Discharge status: Home / Self Care | Payer: BLUE CROSS/BLUE SHIELD | Source: Ambulatory Visit | Attending: Family Medicine | Admitting: Family Medicine

## 2019-04-01 DIAGNOSIS — M545 Low back pain, unspecified: Secondary | ICD-10-CM

## 2020-06-11 ENCOUNTER — Encounter (HOSPITAL_COMMUNITY): Payer: Self-pay

## 2020-06-11 ENCOUNTER — Other Ambulatory Visit: Payer: Self-pay

## 2020-06-11 ENCOUNTER — Emergency Department (HOSPITAL_COMMUNITY)
Admission: EM | Admit: 2020-06-11 | Discharge: 2020-06-11 | Disposition: A | Discharge status: Home / Self Care | Payer: BC Managed Care – PPO | Attending: Emergency Medicine | Admitting: Emergency Medicine

## 2020-06-11 ENCOUNTER — Emergency Department (HOSPITAL_COMMUNITY): Payer: BC Managed Care – PPO

## 2020-06-11 DIAGNOSIS — Z87891 Personal history of nicotine dependence: Secondary | ICD-10-CM | POA: Insufficient documentation

## 2020-06-11 DIAGNOSIS — Z8546 Personal history of malignant neoplasm of prostate: Secondary | ICD-10-CM | POA: Insufficient documentation

## 2020-06-11 DIAGNOSIS — R0602 Shortness of breath: Secondary | ICD-10-CM | POA: Diagnosis present

## 2020-06-11 DIAGNOSIS — Z20822 Contact with and (suspected) exposure to covid-19: Secondary | ICD-10-CM | POA: Diagnosis not present

## 2020-06-11 DIAGNOSIS — I10 Essential (primary) hypertension: Secondary | ICD-10-CM | POA: Insufficient documentation

## 2020-06-11 DIAGNOSIS — Z79899 Other long term (current) drug therapy: Secondary | ICD-10-CM | POA: Insufficient documentation

## 2020-06-11 DIAGNOSIS — J40 Bronchitis, not specified as acute or chronic: Secondary | ICD-10-CM

## 2020-06-11 LAB — BASIC METABOLIC PANEL
Anion gap: 12 (ref 5–15)
BUN: 16 mg/dL (ref 6–20)
CO2: 24 mmol/L (ref 22–32)
Calcium: 9.6 mg/dL (ref 8.9–10.3)
Chloride: 104 mmol/L (ref 98–111)
Creatinine, Ser: 1.19 mg/dL (ref 0.61–1.24)
GFR, Estimated: >60 mL/min (ref >60)
Glucose, Bld: 163 mg/dL — ABNORMAL HIGH (ref 70–99)
Potassium: 3.8 mmol/L (ref 3.5–5.1)
Sodium: 140 mmol/L (ref 135–145)

## 2020-06-11 LAB — RESPIRATORY PANEL BY RT PCR (FLU A&B, COVID)
Influenza A by PCR: NEGATIVE
Influenza B by PCR: NEGATIVE
SARS Coronavirus 2 by RT PCR: NEGATIVE

## 2020-06-11 LAB — CBC
HCT: 45 % (ref 39.0–52.0)
Hemoglobin: 14.6 g/dL (ref 13.0–17.0)
MCH: 27.6 pg (ref 26.0–34.0)
MCHC: 32.4 g/dL (ref 30.0–36.0)
MCV: 85.1 fL (ref 80.0–100.0)
Platelets: 371 10*3/uL (ref 150–400)
RBC: 5.29 MIL/uL (ref 4.22–5.81)
RDW: 14.5 % (ref 11.5–15.5)
WBC: 12.2 10*3/uL — ABNORMAL HIGH (ref 4.0–10.5)
nRBC: 0 % (ref 0.0–0.2)

## 2020-06-11 LAB — TROPONIN I (HIGH SENSITIVITY)
Troponin I (High Sensitivity): 7 ng/L (ref <18)
Troponin I (High Sensitivity): 7 ng/L (ref <18)

## 2020-06-11 LAB — D-DIMER, QUANTITATIVE: D-Dimer, Quant: 0.31 ug/mL-FEU (ref 0.00–0.50)

## 2020-06-11 MED ORDER — DOXYCYCLINE HYCLATE 100 MG PO CAPS: 100.0000 mg | ORAL_CAPSULE | Freq: Two times a day (BID) | ORAL | 0 refills | Status: DC

## 2020-06-11 MED ORDER — HYDROCODONE-ACETAMINOPHEN 5-325 MG PO TABS
1.0000 | ORAL_TABLET | Freq: Once | ORAL | Status: AC
  Administered 2020-06-11: 1 via ORAL
  Filled 2020-06-11: qty 1

## 2020-06-11 NOTE — Discharge Instructions (Addendum)
Follow-up with your regular doctor this week for recheck return if problem

## 2020-06-11 NOTE — ED Provider Notes (Signed)
[facility] DEPT Provider Note   CSN: [id number] Arrival date & time: 06/11/20  0109     History Chief Complaint  Patient presents with   Shortness of Breath    Adam Sampson is a 59 y.o. male.  Complains of mild cough and some chest pain with inspiration no fevers no chills  The history is provided by the patient and medical records. No language interpreter was used.  Shortness of Breath Severity:  Mild Onset quality:  Sudden Timing:  Constant Progression:  Waxing and waning Chronicity:  New Context: not activity   Relieved by:  Nothing Worsened by:  Nothing Associated symptoms: no abdominal pain, no chest pain, no cough, no headaches and no rash        Past Medical History:  Diagnosis Date   Cancer ([place])    prostate   GERD (gastroesophageal reflux disease)    Hypertension    Pneumonia    hx of as a child    Patient Active Problem List   Diagnosis Date Noted   Prostate cancer ([place]) 10/24/2016    Past Surgical History:  Procedure Laterality Date   LYMPHADENECTOMY Bilateral 10/24/2016   Procedure: PELVIC LYMPHADENECTOMY;  Surgeon: Raynelle Bring, MD;  Location: [facility];  Service: Urology;  Laterality: Bilateral;   Right knee arthroscopy     ROBOT ASSISTED LAPAROSCOPIC RADICAL PROSTATECTOMY N/A 10/24/2016   Procedure: XI ROBOTIC ASSISTED LAPAROSCOPIC RADICAL PROSTATECTOMY LEVEL 2;  Surgeon: Raynelle Bring, MD;  Location: [facility];  Service: Urology;  Laterality: N/A;   Vascectomy     1998       History reviewed. No pertinent family history.  Social History   Tobacco Use   Smoking status: Never Smoker   Smokeless tobacco: Former Network engineer Use Topics   Alcohol use: No   Drug use: No    Home Medications Prior to Admission medications   Medication Sig Start Date End Date Taking? Authorizing Provider  amLODipine (NORVASC) 10 MG tablet Take 10 mg by mouth daily. 05/19/20  Yes [provider]   BIOTIN PO Take 1 tablet by mouth daily.   Yes [provider]  carvedilol (COREG) 12.5 MG tablet Take 12.5 mg by mouth 2 (two) times daily. 05/19/20  Yes [provider]  fluticasone (FLONASE) 50 MCG/ACT nasal spray Place 1 spray into both nostrils daily as needed for allergies or rhinitis.   Yes [provider]  naproxen sodium (ALEVE) 220 MG tablet Take 440 mg by mouth 2 (two) times daily as needed (headache/pain).   Yes [provider]  pantoprazole (PROTONIX) 40 MG tablet Take 40 mg by mouth daily. 05/19/20  Yes [provider]  rosuvastatin (CRESTOR) 10 MG tablet Take 10 mg by mouth daily. 05/19/20  Yes [provider]  VITAMIN E PO Take 1 tablet by mouth daily.   Yes [provider]  doxycycline (VIBRAMYCIN) 100 MG capsule Take 1 capsule (100 mg total) by mouth 2 (two) times daily. One po bid x 7 days 06/11/20   Milton Ferguson, MD  HYDROcodone-acetaminophen [facility]) 5-325 MG tablet Take 1-2 tablets by mouth every 6 (six) hours as needed for moderate pain or severe pain. Patient not taking: Reported on 06/11/2020 10/24/16   Debbrah Alar, PA-C  sulfamethoxazole-trimethoprim (BACTRIM DS,SEPTRA DS) 800-160 MG tablet Take 1 tablet by mouth 2 (two) times daily. Start the day prior to foley removal appointment Patient not taking: Reported on 06/11/2020 10/24/16   Debbrah Alar, PA-C  Allergies    Patient has no known allergies.  Review of Systems   Review of Systems  Constitutional: Negative for appetite change and fatigue.  HENT: Negative for congestion, ear discharge and sinus pressure.   Eyes: Negative for discharge.  Respiratory: Positive for shortness of breath. Negative for cough.   Cardiovascular: Negative for chest pain.  Gastrointestinal: Negative for abdominal pain and diarrhea.  Genitourinary: Negative for frequency and hematuria.  Musculoskeletal: Negative for back pain.  Skin: Negative for rash.  Neurological:  Negative for seizures and headaches.  Psychiatric/Behavioral: Negative for hallucinations.    Physical Exam Updated Vital Signs BP (!) 162/94 (BP Location: Right Arm)    Pulse 74    Temp 98 F (36.7 C) (Oral)    Resp 18    Ht 5' 9.5" (1.765 m)    Wt 113.4 kg    SpO2 96%    BMI 36.39 kg/m   Physical Exam Vitals and nursing note reviewed.  Constitutional:      Appearance: He is well-developed.  HENT:     Head: Normocephalic.     Nose: Nose normal.  Eyes:     General: No scleral icterus.    Conjunctiva/sclera: Conjunctivae normal.  Neck:     Thyroid: No thyromegaly.  Cardiovascular:     Rate and Rhythm: Normal rate and regular rhythm.     Heart sounds: No murmur heard.  No friction rub. No gallop.   Pulmonary:     Breath sounds: No stridor. No wheezing or rales.  Chest:     Chest wall: No tenderness.  Abdominal:     General: There is no distension.     Tenderness: There is no abdominal tenderness. There is no rebound.  Musculoskeletal:        General: Normal range of motion.     Cervical back: Neck supple.  Lymphadenopathy:     Cervical: No cervical adenopathy.  Skin:    Findings: No erythema or rash.  Neurological:     Mental Status: He is alert and oriented to person, place, and time.     Motor: No abnormal muscle tone.     Coordination: Coordination normal.  Psychiatric:        Behavior: Behavior normal.     ED Results / Procedures / Treatments   Labs (all labs ordered are listed, but only abnormal results are displayed) Labs Reviewed  BASIC METABOLIC PANEL - Abnormal; Notable for the following components:      Result Value   Glucose, Bld 163 (*)    All other components within normal limits  CBC - Abnormal; Notable for the following components:   WBC 12.2 (*)    All other components within normal limits  RESPIRATORY PANEL BY RT PCR (FLU A&B, COVID)  D-DIMER, QUANTITATIVE (NOT AT [facility])  TROPONIN I (HIGH SENSITIVITY)  TROPONIN I (HIGH SENSITIVITY)     EKG EKG Interpretation  Date/Time:  Sunday June 11 2020 08:20:25 EDT Ventricular Rate:  82 PR Interval:    QRS Duration: 82 QT Interval:  357 QTC Calculation: 417 R Axis:   -31 Text Interpretation: Sinus rhythm Left axis deviation Low voltage, precordial leads 12 Lead; [place] Confirmed by Milton Ferguson [phone]) on 06/11/2020 11:43:22 AM Also confirmed by Milton Ferguson [phone])  on 06/11/2020 11:44:19 AM   Radiology DG Chest 2 View  Result Date: 06/11/2020 CLINICAL DATA:  RIGHT-sided chest pain and shortness of breath EXAM: CHEST - 2 VIEW COMPARISON:  October 14, 2016 FINDINGS: The cardiomediastinal silhouette  is unchanged in contour.Unchanged trace fluid in the RIGHT minor fissure. No pleural effusion. No pneumothorax. No acute pleuroparenchymal abnormality. Visualized abdomen is unremarkable. Multilevel degenerative changes of the thoracic spine. IMPRESSION: No acute cardiopulmonary abnormality. Electronically Signed   By: Valentino Saxon MD   On: 06/11/2020 09:49    Procedures Procedures (including critical care time)  Medications Ordered in ED Medications  HYDROcodone-acetaminophen (NORCO/VICODIN) 5-325 MG per tablet 1 tablet (1 tablet Oral Given 06/11/20 1211)    ED Course  I have reviewed the triage vital signs and the nursing notes.  Pertinent labs & imaging results that were available during my care of the patient were reviewed by me and considered in my medical decision making (see chart for details).    MDM Rules/Calculators/A&P                          Patient with bronchitis.  He will be discharged home with doxycycline     This patient presents to the ED for concern of cough this involves an extensive number of treatment options, and is a complaint that carries with it a high risk of complications and morbidity.  The differential diagnosis includes pneumonia bronchitis   Lab Tests:   I Ordered, reviewed, and interpreted labs, which  included CBC chemistries which show white count mildly elevated.  Covid test negative  Medicines ordered:   Doxycycline for bronchitis  Imaging Studies ordered:   I ordered imaging studies which included chest x-ray  I independently visualized and interpreted imaging which showed negative  Additional history obtained:   Additional history obtained from records  Previous records obtained and reviewed.  Consultations Obtained:     Reevaluation:  After the interventions stated above, I reevaluated the patient and found mild improvement  Critical Interventions:     Final Clinical Impression(s) / ED Diagnoses Final diagnoses:  Bronchitis    Rx / DC Orders ED Discharge Orders         Ordered    doxycycline (VIBRAMYCIN) 100 MG capsule  2 times daily        06/11/20 1426           Milton Ferguson, MD 06/16/20 1134

## 2020-06-11 NOTE — ED Triage Notes (Signed)
Pt arrived via walk in, c/o SOB, and cont chest pain, he describes as dull right sided, non reproducible all starting this morning when he woke up. States non productive cough x2 days. Denies any sick contacts.

## 2020-11-15 IMAGING — CR LUMBAR SPINE - 2-3 VIEW
3 series · 3 of 3 positions shown · non-contrast
Comparison: Chest x-ray 10/14/2016.

CLINICAL DATA: Low back pain.  No injury.

EXAM:
LUMBAR SPINE - 2-3 VIEW

[t l-spine a.p.]
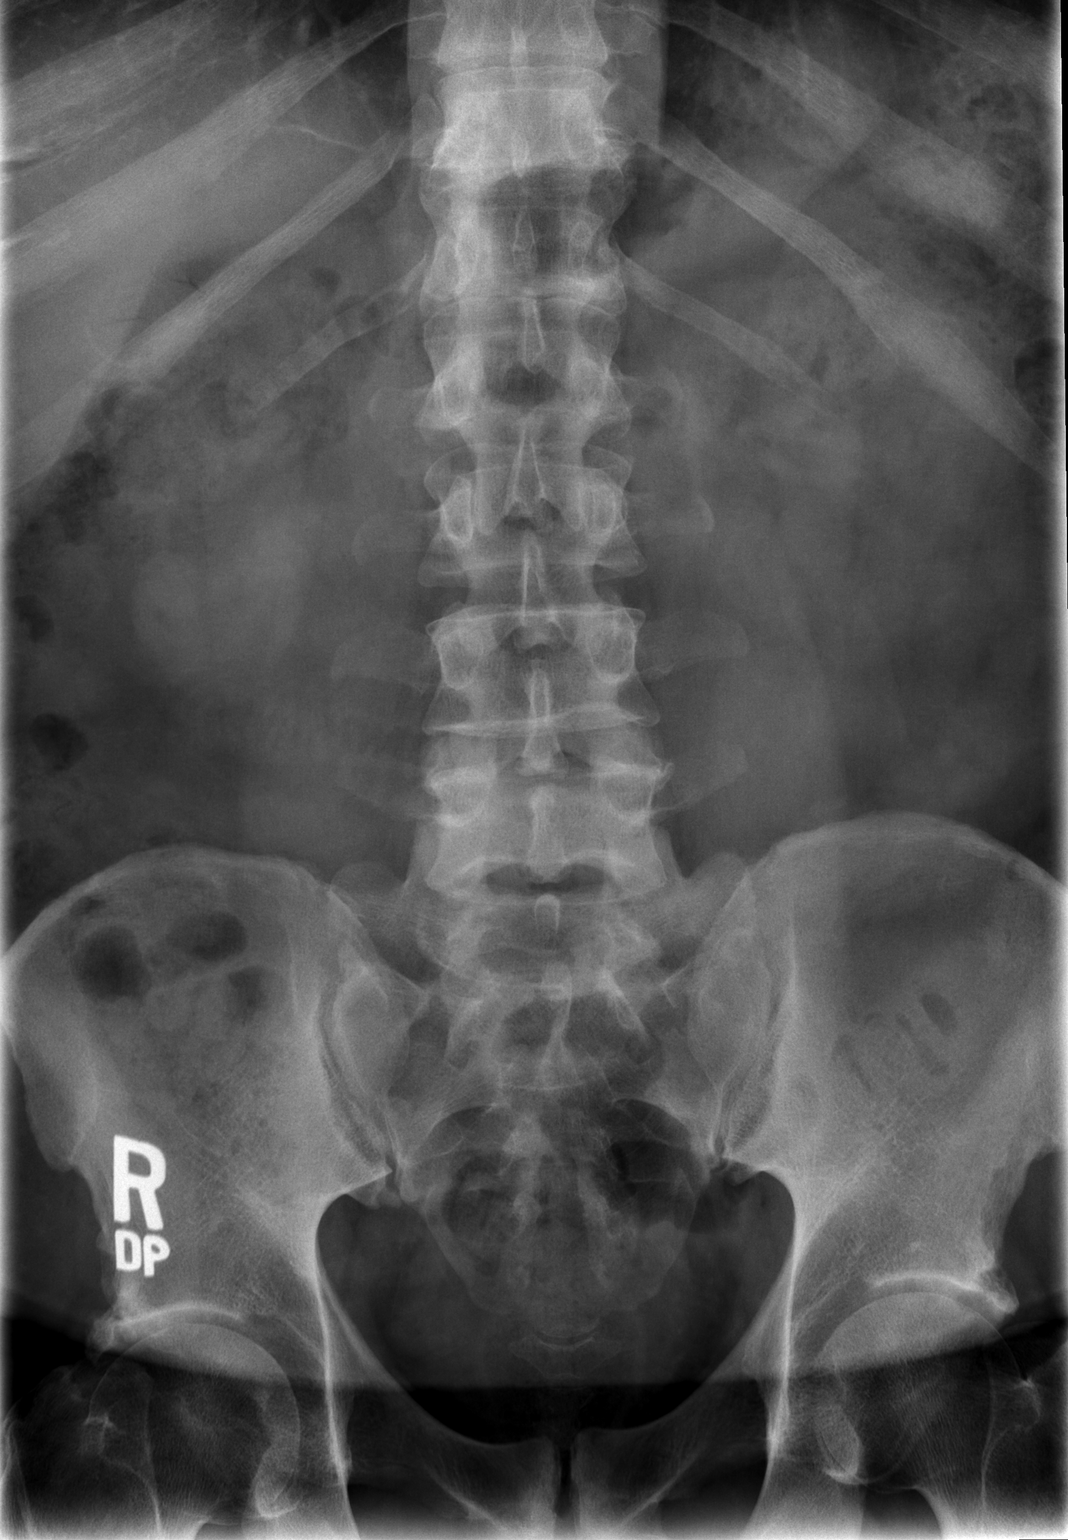

[t l-spine lat]
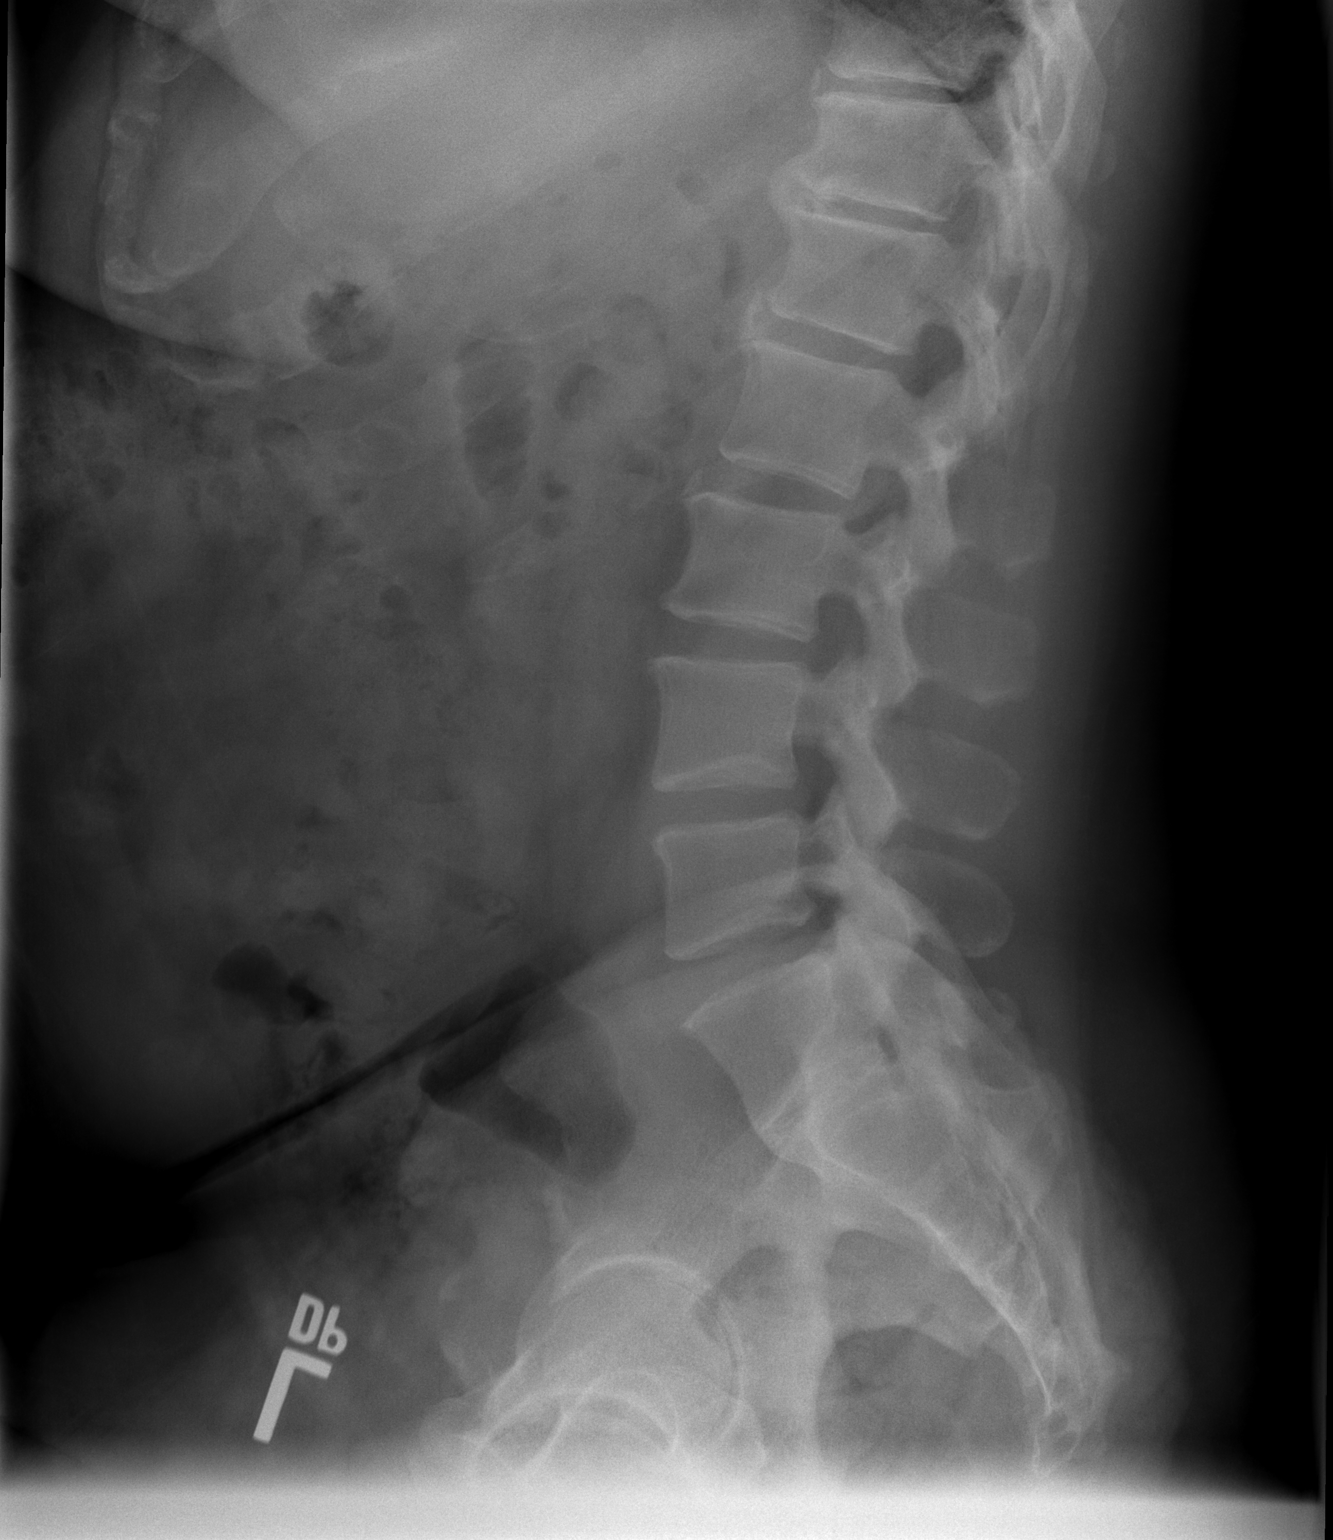

[t l-spine l5-s1 spot]
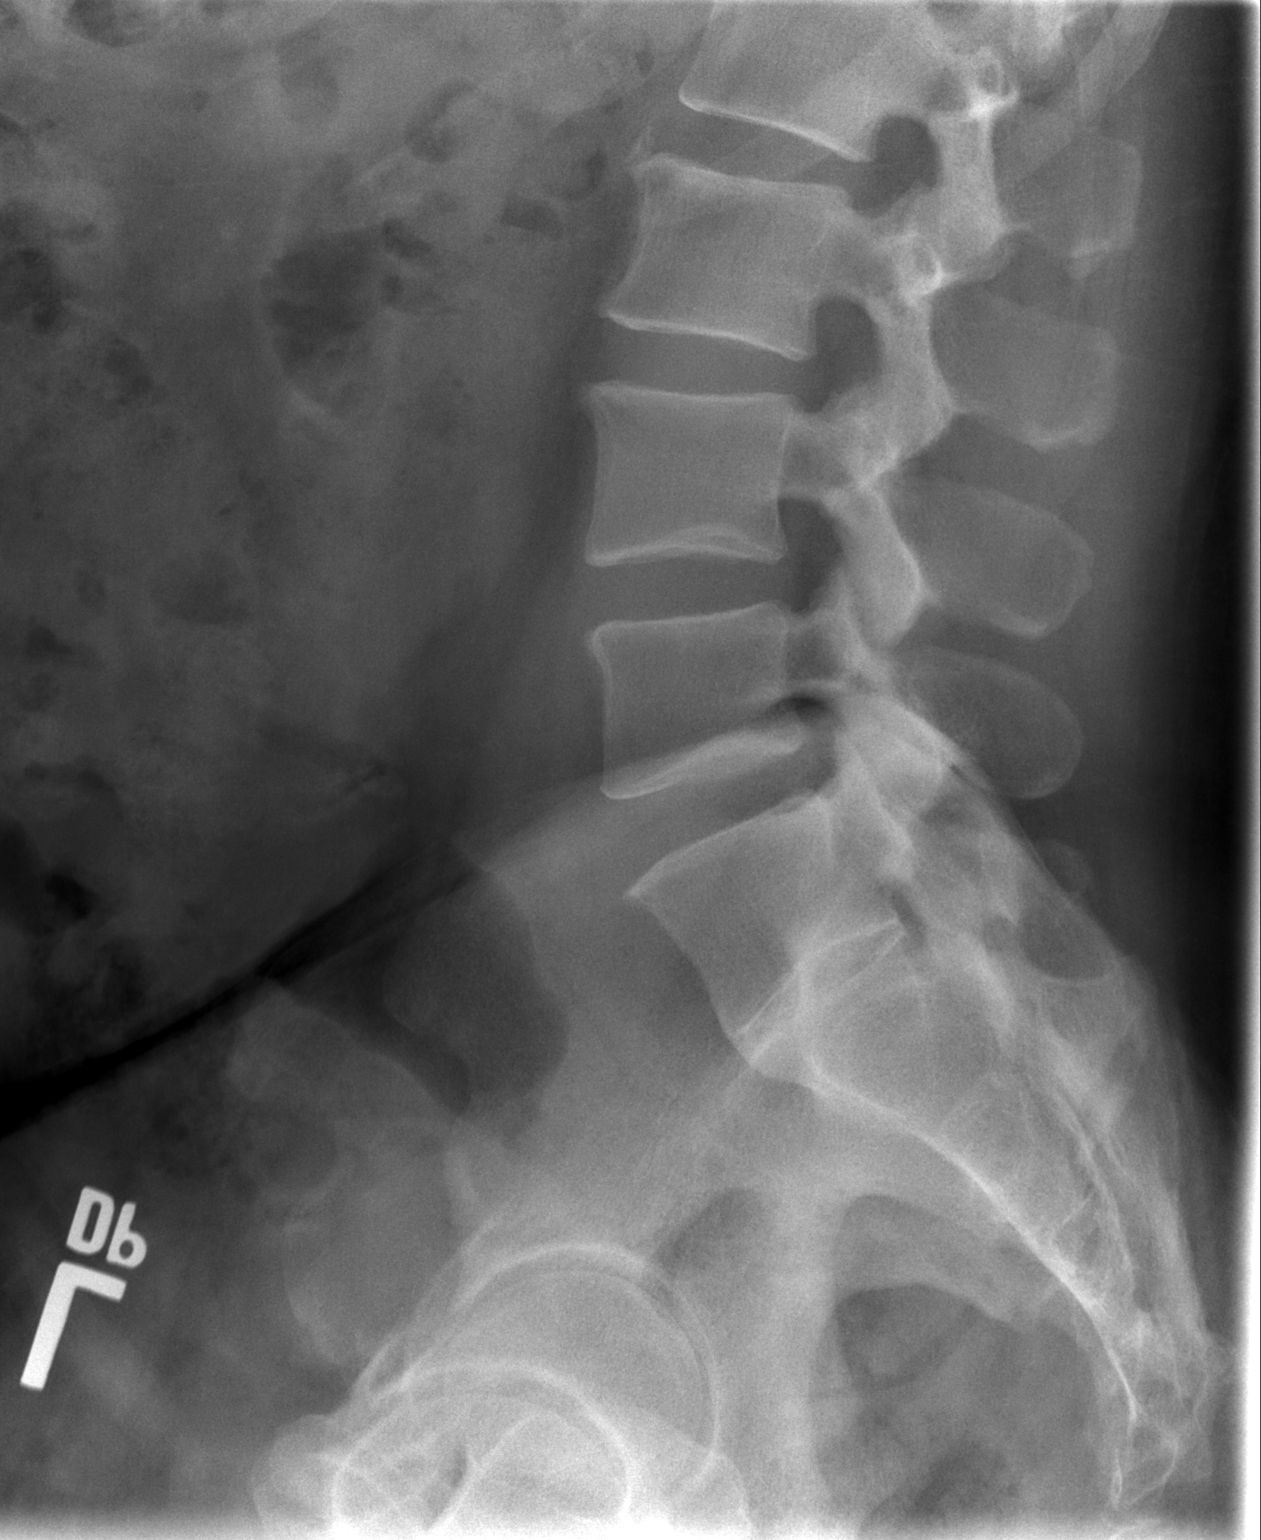

[3 of 3 positions shown; findings below may reference images not displayed]

FINDINGS: Soft tissue structures are unremarkable. Diffuse mild thoracolumbar
spine degenerative change. No acute abnormality. Normal
mineralization and alignment.
IMPRESSION: Mild thoracolumbar spine degenerative change.  No acute abnormality.

## 2021-06-14 ENCOUNTER — Ambulatory Visit (INDEPENDENT_AMBULATORY_CARE_PROVIDER_SITE_OTHER): Payer: BC Managed Care – PPO | Admitting: Allergy & Immunology

## 2021-06-14 ENCOUNTER — Other Ambulatory Visit: Payer: Self-pay

## 2021-06-14 ENCOUNTER — Encounter: Payer: Self-pay | Admitting: Allergy & Immunology

## 2021-06-14 VITALS — BP 132/78 | HR 77 | Temp 98.3°F | Resp 16 | Ht 68.5 in | Wt 254.6 lb

## 2021-06-14 DIAGNOSIS — J302 Other seasonal allergic rhinitis: Secondary | ICD-10-CM

## 2021-06-14 DIAGNOSIS — J4520 Mild intermittent asthma, uncomplicated: Secondary | ICD-10-CM

## 2021-06-14 DIAGNOSIS — J3089 Other allergic rhinitis: Secondary | ICD-10-CM

## 2021-06-14 NOTE — Progress Notes (Signed)
NEW PATIENT  Date of Service/Encounter:  06/14/21  Consult requested by: Adam Contras, MD   Assessment:   Mild intermittent asthma, uncomplicated  Seasonal and perennial allergic rhinitis (outdoor molds and dust mites)  Plan/Recommendations:    1. Mild intermittent asthma, uncomplicated - Lung testing looked normal and it did not get any better with the albuterol treatment. - We are going to start an inhaled steroid during the spring time only (since this seems to be the main time for your symptoms). - We are going to send in the inhaler to a mail order pharmacy that should apply a $20 copay card.  - Daily controller medication(s): Armonair 190mch one puff twice daily (February through May)  - Prior to physical activity: ProAir Digihaler 2 puffs 10-15 minutes before physical activity. - Rescue medications: ProAir Digihaler 4 puffs every 4-6 hours as needed - Asthma control goals:  * Full participation in all desired activities (may need albuterol before activity) * Albuterol use two time or less a week on average (not counting use with activity) * Cough interfering with sleep two time or less a month * Oral steroids no more than once a year * No hospitalizations  2. Seasonal and perennial allergic rhinitis - Testing today showed: outdoor molds and dust mites - Copy of test results provided.  - Avoidance measures provided. - Start taking: Xyzal (levocetirizine) 5mg  tablet once daily and Nasacort (triamcinolone) one spray per nostril daily - You can use an extra dose of the antihistamine, if needed, for breakthrough symptoms.  - Consider nasal saline rinses 1-2 times daily to remove allergens from the nasal cavities as well as help with mucous clearance (this is especially helpful to do before the nasal sprays are given) - Consider allergy shots as a means of long-term control. - Allergy shots "re-train" and "reset" the immune system to ignore environmental allergens and  decrease the resulting immune response to those allergens (sneezing, itchy watery eyes, runny nose, nasal congestion, etc).    - Allergy shots improve symptoms in 75-85% of patients.  - We can discuss more at the next appointment if the medications are not working for you.  3. Return in about 4 months (around 10/15/2021).    This note in its entirety was forwarded to the Provider who requested this consultation.  Subjective:   Adam Sampson is a 60 y.o. male presenting today for evaluation of  Chief Complaint  Patient presents with   Wheezing    Adam Sampson has a history of the following: Patient Active Problem List   Diagnosis Date Noted   Prostate cancer ([place]) 10/24/2016    History obtained from: chart review and patient.  Dalia Heading was referred by Adam Contras, MD.     [name] is a 60 y.o. male presenting for an evaluation of wheezing .   Asthma/Respiratory Symptom History: He has been having some issues with wheezing. He awakens in the middle of the night when the temperature is too high. This is mostly when he is sleeping. He started having problems when he was in the truck. 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 It started out once summer when the [employer] went out in his truck. He was in [place] for 3 days in the middle of the summer. He reports that he was having issues with breathing. He was also having some trouble breathing in the truck and not being able to breath.   He  has never been on an inhaler. He does use prednisone occasionally. Keeping his house cold seems to have made things better. Last time with breathing was 4-5 months ago. He has them suddenly. They are not daily by any means.   He did have an albuterol inhaler around 2005 or so. He has issues with air fresheners and other scents.   Skin Symptom History: He has a rash on his face that is not itchy consistently.He has tried using cortisone which does not help. It does not spread for  the most part.  He has no   GERD Symptom History: He is on pantoprazole daily.  We added on Pepcid and his last PCP visit.  This was July 2022.  Otherwise, there is no history of other atopic diseases, including food allergies, drug allergies, environmental allergies, stinging insect allergies, urticaria, or contact dermatitis. There is no significant infectious history. Vaccinations are up to date.    Past Medical History: Patient Active Problem List   Diagnosis Date Noted   Prostate cancer ([place]) 10/24/2016    Medication List:  Allergies as of 06/14/2021   No Known Allergies      Medication List        Accurate as of June 14, 2021 11:59 PM. If you have any questions, ask your nurse or doctor.          STOP taking these medications    doxycycline 100 MG capsule Commonly known as: VIBRAMYCIN Stopped by: Adam Shaggy, MD   HYDROcodone-acetaminophen 5-325 MG tablet Commonly known as: Norco Stopped by: Adam Shaggy, MD   sulfamethoxazole-trimethoprim 800-160 MG tablet Commonly known as: BACTRIM DS Stopped by: Adam Shaggy, MD       TAKE these medications    amLODipine 10 MG tablet Commonly known as: NORVASC Take 10 mg by mouth daily.   BIOTIN PO Take 1 tablet by mouth daily.   carvedilol 12.5 MG tablet Commonly known as: COREG Take 12.5 mg by mouth 2 (two) times daily.   fluticasone 50 MCG/ACT nasal spray Commonly known as: FLONASE Place 1 spray into both nostrils daily as needed for allergies or rhinitis.   naproxen sodium 220 MG tablet Commonly known as: ALEVE Take 440 mg by mouth 2 (two) times daily as needed (headache/pain).   olmesartan 20 MG tablet Commonly known as: BENICAR Take 20 mg by mouth daily.   pantoprazole 40 MG tablet Commonly known as: PROTONIX Take 40 mg by mouth daily.   rosuvastatin 10 MG tablet Commonly known as: CRESTOR Take 10 mg by mouth daily.   VITAMIN E PO Take 1 tablet by mouth daily.         Birth History: non-contributory  Developmental History: non-contributory  Past Surgical History: Past Surgical History:  Procedure Laterality Date   LYMPHADENECTOMY Bilateral 10/24/2016   Procedure: PELVIC LYMPHADENECTOMY;  Surgeon: Adam Bring, MD;  Location: [facility];  Service: Urology;  Laterality: Bilateral;   Right knee arthroscopy     ROBOT ASSISTED LAPAROSCOPIC RADICAL PROSTATECTOMY N/A 10/24/2016   Procedure: XI ROBOTIC ASSISTED LAPAROSCOPIC RADICAL PROSTATECTOMY LEVEL 2;  Surgeon: Adam Bring, MD;  Location: [facility];  Service: Urology;  Laterality: N/A;   Vascectomy     1998     Family History: History reviewed. No pertinent family history.   Social History: Ab[place] lives at home with his family.  He lives in a house that was built in 1974.  There is carpeting throughout the home.  There are no animals inside or  outside of the home.  He has a pump for heating and cooling.  There are dust mite covers on the bed and the pillows.  There is no tobacco exposure.  Currently works as a Administrator.  He is not exposed to fumes, chemicals, or dust.  There is no HEPA filter.  They do not live near an interstate or industrial area.  Regarding their service   Review of Systems  Constitutional: Negative.  Negative for chills, fever, malaise/fatigue and weight loss.  HENT: Negative.  Negative for congestion, ear discharge and ear pain.   Eyes:  Negative for pain, discharge and redness.  Respiratory:  Positive for cough and wheezing. Negative for sputum production and shortness of breath.   Cardiovascular: Negative.  Negative for chest pain and palpitations.  Gastrointestinal:  Negative for abdominal pain, constipation, diarrhea, heartburn, nausea and vomiting.  Skin: Negative.  Negative for itching and rash.  Neurological:  Negative for dizziness and headaches.  Endo/Heme/Allergies:  Negative for environmental allergies. Does not bruise/bleed easily.      Objective:    Blood pressure 132/78, pulse 77, temperature 98.3 F (36.8 C), resp. rate 16, height 5' 8.5" (1.74 m), weight 254 lb 9.6 oz (115.5 kg), SpO2 96 %. Body mass index is 38.15 kg/m.   Physical Exam:   Physical Exam Vitals reviewed.  Constitutional:      Appearance: He is well-developed.     Comments: Very pleasant.   HENT:     Head: Normocephalic and atraumatic.     Right Ear: Tympanic membrane, ear canal and external ear normal. No drainage, swelling or tenderness. Tympanic membrane is not injected, scarred, erythematous, retracted or bulging.     Left Ear: Tympanic membrane, ear canal and external ear normal. No drainage, swelling or tenderness. Tympanic membrane is not injected, scarred, erythematous, retracted or bulging.     Nose: No nasal deformity, septal deviation, mucosal edema or rhinorrhea.     Right Turbinates: Enlarged, swollen and pale.     Left Turbinates: Enlarged, swollen and pale.     Right Sinus: No maxillary sinus tenderness or frontal sinus tenderness.     Left Sinus: No maxillary sinus tenderness or frontal sinus tenderness.     Mouth/Throat:     Mouth: Mucous membranes are not pale and not dry.     Pharynx: Uvula midline.  Eyes:     General:        Right eye: No discharge.        Left eye: No discharge.     Conjunctiva/sclera: Conjunctivae normal.     Right eye: Right conjunctiva is not injected. No chemosis.    Left eye: Left conjunctiva is not injected. No chemosis.    Pupils: Pupils are equal, round, and reactive to light.  Cardiovascular:     Rate and Rhythm: Normal rate and regular rhythm.     Heart sounds: Normal heart sounds.  Pulmonary:     Effort: Pulmonary effort is normal. No tachypnea, accessory muscle usage or respiratory distress.     Breath sounds: Normal breath sounds. No wheezing, rhonchi or rales.     Comments: Moving air well in all lung fields. No crackles or wheezes noted.  Chest:     Chest wall: No tenderness.  Abdominal:      Tenderness: There is no abdominal tenderness. There is no guarding or rebound.  Lymphadenopathy:     Head:     Right side of head: No submandibular, tonsillar or occipital adenopathy.  Left side of head: No submandibular, tonsillar or occipital adenopathy.     Cervical: No cervical adenopathy.  Skin:    General: Skin is warm.     Capillary Refill: Capillary refill takes less than 2 seconds.     Coloration: Skin is not pale.     Findings: No abrasion, erythema, petechiae or rash. Rash is not papular, urticarial or vesicular.     Comments: No eczematous or urticarial lesions noted.   Neurological:     Mental Status: He is alert.  Psychiatric:        Behavior: Behavior is cooperative.     Diagnostic studies:   Spirometry: results normal (FEV1: 2.16, FVC: 2.58, FEV1/FVC: 0.84%).    Spirometry consistent with normal pattern.   Allergy Studies:   Percutaneous testing: negative to the entire panel with adequate controls   Intradermal testing: positive to outdoor molds and dust mites with adequate controls    Allergy testing results were read and interpreted by myself, documented by clinical staff.         Salvatore Marvel, MD [facility]

## 2021-06-14 NOTE — Patient Instructions (Addendum)
1. Mild intermittent asthma, uncomplicated - Lung testing looked normal and it did not get any better with the albuterol treatment. - We are going to start an inhaled steroid during the spring time only (since this seems to be the main time for your symptoms). - We are going to send in the inhaler to a mail order pharmacy that should apply a $20 copay card.  - Daily controller medication(s): Armonair 137mch one puff twice daily (February through May)  - Prior to physical activity: ProAir Digihaler 2 puffs 10-15 minutes before physical activity. - Rescue medications: ProAir Digihaler 4 puffs every 4-6 hours as needed - Asthma control goals:  * Full participation in all desired activities (may need albuterol before activity) * Albuterol use two time or less a week on average (not counting use with activity) * Cough interfering with sleep two time or less a month * Oral steroids no more than once a year * No hospitalizations  2. Seasonal and perennial allergic rhinitis - Testing today showed: outdoor molds and dust mites - Copy of test results provided.  - Avoidance measures provided. - Start taking: Xyzal (levocetirizine) 5mg  tablet once daily and Nasacort (triamcinolone) one spray per nostril daily - You can use an extra dose of the antihistamine, if needed, for breakthrough symptoms.  - Consider nasal saline rinses 1-2 times daily to remove allergens from the nasal cavities as well as help with mucous clearance (this is especially helpful to do before the nasal sprays are given) - Consider allergy shots as a means of long-term control. - Allergy shots "re-train" and "reset" the immune system to ignore environmental allergens and decrease the resulting immune response to those allergens (sneezing, itchy watery eyes, runny nose, nasal congestion, etc).    - Allergy shots improve symptoms in 75-85% of patients.  - We can discuss more at the next appointment if the medications are not working for  you.  3. Return in about 4 months (around 10/15/2021).    Please inform us of any Emergency Department visits, hospitalizations, or changes in symptoms. Call us before going to the ED for breathing or allergy symptoms since we might be able to fit you in for a sick visit. Feel free to contact us anytime with any questions, problems, or concerns.  It was a pleasure to meet you today!  Websites that have reliable patient information: 1. American Academy of Asthma, Allergy, and Immunology: www.aaaai.org 2. Food Allergy Research and Education (FARE): foodallergy.org 3. Mothers of Asthmatics: http://www.asthmacommunitynetwork.org 4. American College of Allergy, Asthma, and Immunology: www.acaai.org   COVID-19 Vaccine Information can be found at: [email or web address] For questions related to vaccine distribution or appointments, please email [email or web address] or call [phone].   We realize that you might be concerned about having an allergic reaction to the COVID19 vaccines. To help with that concern, WE ARE OFFERING THE COVID19 VACCINES IN OUR OFFICE! Ask the front desk for dates!     "Like" [place] on Facebook and Instagram for our latest updates!      A healthy democracy works best when [employer] participate! Make sure you are registered to vote! If you have moved or changed any of your contact information, you will need to get this updated before voting!  In some cases, you MAY be able to register to vote online: [email or web address]    Control of [place] and fungi can grow on a variety of surfaces provided certain temperature and moisture conditions exist.  Outdoor molds grow  on plants, decaying vegetation and soil.  The major outdoor mold, Alternaria and Cladosporium, are found in very high numbers during hot and dry conditions.  Generally, a late Summer - Fall peak is seen for  common outdoor fungal spores.  Rain will temporarily lower outdoor mold spore count, but counts rise rapidly when the rainy period ends.  The most important indoor molds are Aspergillus and Penicillium.  Dark, humid and poorly ventilated basements are ideal sites for mold growth.  The next most common sites of mold growth are the bathroom and the kitchen.  Outdoor (Seasonal) Mold Control  Positive outdoor molds via skin testing: Alternaria, Cladosporium, Bipolaris (Helminthsporium), Drechslera (Curvalaria), and Mucor  Use air conditioning and keep windows closed Avoid exposure to decaying vegetation. Avoid leaf raking. Avoid grain handling. Consider wearing a face mask if working in moldy areas.     Control of Dust Mite Allergen    Dust mites play a major role in allergic asthma and rhinitis.  They occur in environments with high humidity wherever human skin is found.  Dust mites absorb humidity from the atmosphere (ie, they do not drink) and feed on organic matter (including shed human and animal skin).  Dust mites are a microscopic type of insect that you cannot see with the naked eye.  High levels of dust mites have been detected from mattresses, pillows, carpets, upholstered furniture, bed covers, clothes, soft toys and any woven material.  The principal allergen of the dust mite is found in its feces.  A gram of dust may contain 1,000 mites and 250,000 fecal particles.  Mite antigen is easily measured in the air during house cleaning activities.  Dust mites do not bite and do not cause harm to humans, other than by triggering allergies/asthma.    Ways to decrease your exposure to dust mites in your home:  Encase mattresses, box springs and pillows with a mite-impermeable barrier or cover   Wash sheets, blankets and drapes weekly in hot water (130 F) with detergent and dry them in a dryer on the hot setting.  Have the room cleaned frequently with a vacuum cleaner and a damp dust-mop.  For  carpeting or rugs, vacuuming with a vacuum cleaner equipped with a high-efficiency particulate air (HEPA) filter.  The dust mite allergic individual should not be in a room which is being cleaned and should wait 1 hour after cleaning before going into the room. Do not sleep on upholstered furniture (eg, couches).   If possible removing carpeting, upholstered furniture and drapery from the home is ideal.  Horizontal blinds should be eliminated in the rooms where the person spends the most time (bedroom, study, television room).  Washable vinyl, roller-type shades are optimal. Remove all non-washable stuffed toys from the bedroom.  Wash stuffed toys weekly like sheets and blankets above.   Reduce indoor humidity to less than 50%.  Inexpensive humidity monitors can be purchased at most hardware stores.  Do not use a humidifier as can make the problem worse and are not recommended.

## 2021-06-18 ENCOUNTER — Encounter: Payer: Self-pay | Admitting: Allergy & Immunology

## 2021-10-18 ENCOUNTER — Encounter: Payer: Self-pay | Admitting: Allergy & Immunology

## 2021-10-18 ENCOUNTER — Ambulatory Visit (INDEPENDENT_AMBULATORY_CARE_PROVIDER_SITE_OTHER): Payer: BC Managed Care – PPO | Admitting: Allergy & Immunology

## 2021-10-18 ENCOUNTER — Other Ambulatory Visit: Payer: Self-pay

## 2021-10-18 VITALS — BP 140/90 | HR 91 | Temp 98.7°F | Resp 18 | Ht 68.0 in | Wt 262.6 lb

## 2021-10-18 DIAGNOSIS — J3089 Other allergic rhinitis: Secondary | ICD-10-CM

## 2021-10-18 DIAGNOSIS — J302 Other seasonal allergic rhinitis: Secondary | ICD-10-CM

## 2021-10-18 DIAGNOSIS — J4520 Mild intermittent asthma, uncomplicated: Secondary | ICD-10-CM

## 2021-10-18 MED ORDER — LEVOCETIRIZINE DIHYDROCHLORIDE 5 MG PO TABS: 5.0000 mg | ORAL_TABLET | Freq: Every evening | ORAL | 5 refills | Status: DC

## 2021-10-18 MED ORDER — AIRDUO DIGIHALER 113-14 MCG/ACT IN AEPB: 1.0000 | INHALATION_SPRAY | Freq: Two times a day (BID) | RESPIRATORY_TRACT | 6 refills | Status: DC

## 2021-10-18 MED ORDER — AIRDUO DIGIHALER 113-14 MCG/ACT IN AEPB: 1.0000 | INHALATION_SPRAY | Freq: Two times a day (BID) | RESPIRATORY_TRACT | 6 refills | Status: AC

## 2021-10-18 NOTE — Progress Notes (Signed)
FOLLOW UP  Date of Service/Encounter:  10/18/21   Assessment:   Moderate persistent asthma, uncomplicated - starting ICS/LABA   Seasonal and perennial allergic rhinitis (outdoor molds and dust mites)  Plan/Recommendations:   1. Mild intermittent asthma, uncomplicated - Lung testing looked lower today. - We are going to start you on a different inhaler today called AirDuo which contains a long acting albuterol and an inhaled steroid. - Sample provided.  - Daily controller medication(s): AirDuo 113/98mcg one puff twice daily (let's do through the year given the fact that you have year round symptoms) - Prior to physical activity: ProAir Digihaler 2 puffs 10-15 minutes before physical activity. - Rescue medications: ProAir Digihaler 4 puffs every 4-6 hours as needed - Asthma control goals:  * Full participation in all desired activities (may need albuterol before activity) * Albuterol use two time or less a week on average (not counting use with activity) * Cough interfering with sleep two time or less a month * Oral steroids no more than once a year * No hospitalizations  2. Seasonal and perennial allergic rhinitis (outdoor molds and dust mites) -  I sent in the Xyzal tablet to help with allergy symptoms. - Continue with the Flonase one spray per nostril daily.  - I think we can hold off on allergy shots for now if the medications are working well for you.  3. Return in about 6 months (around 04/17/2022).     Subjective:   Adam Sampson is a 61 y.o. male presenting today for follow up of  Chief Complaint  Patient presents with   Asthma    4 mth f/u - fine    Adam Sampson has a history of the following: Patient Active Problem List   Diagnosis Date Noted   Prostate cancer ([place]) 10/24/2016    History obtained from: chart review and patient.  Adam Sampson is a 61 y.o. male presenting for a follow up visit. He was last seen in October 2022 as a New Patient. At that time,  we started Armonair 130 mcg 1 puff twice daily from February through May.  We also continue with albuterol as needed.  He had testing that was positive to outdoor molds as well as dust mites.  We started him on Xyzal as well as Nasacort.  Since the last visit, he has mostly done well.   Asthma/Respiratory Symptom History: He has had worsening in February through May. He has had worsening symptoms for the last few weeks. He did come across some Chlorox when he was att work which seems to cause some issues. 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 Otherwise he was doing fine.  The winter has been better than normal. He is not around a lot of mold to his knowledge which is one of his triggers. He was not sick at all over the winter months. 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   Allergic Rhinitis Symptom History: He is having some postnasal drip that started today. He is not having fevers. He is using Flonase.   Otherwise, there have been no changes to his past medical history, surgical history, family history, or social history.    Review of Systems  Constitutional: Negative.  Negative for fever, malaise/fatigue and weight loss.  HENT: Negative.  Negative for congestion, ear discharge and ear pain.  Positive for throat clearing.   Eyes:  Negative for pain, discharge and redness.  Respiratory:  Positive for sputum production and shortness of breath. Negative for cough and wheezing.   Cardiovascular: Negative.  Negative for chest pain and palpitations.  Gastrointestinal:  Negative for abdominal pain, heartburn, nausea and vomiting.  Skin: Negative.  Negative for itching and rash.  Neurological:  Negative for dizziness and headaches.  Endo/Heme/Allergies:  Negative for environmental allergies. Does not bruise/bleed easily.      Objective:    Blood pressure 140/90, pulse 91, temperature 98.7 F (37.1 C), resp. rate 18, height 5\' 8"  (1.727 m), weight 262 lb 9.6 oz (119.1 kg), SpO2 94 %. Body mass index is 39.93 kg/m.    Physical Exam Vitals reviewed.  Constitutional:      Appearance: He is well-developed.     Comments: Very pleasant.   HENT:     Head: Normocephalic and atraumatic.     Right Ear: Tympanic membrane, ear canal and external ear normal. No drainage, swelling or tenderness. Tympanic membrane is not injected, scarred, erythematous, retracted or bulging.     Left Ear: Tympanic membrane, ear canal and external ear normal. No drainage, swelling or tenderness. Tympanic membrane is not injected, scarred, erythematous, retracted or bulging.     Nose: No nasal deformity, septal deviation, mucosal edema or rhinorrhea.     Right Turbinates: Enlarged, swollen and pale.     Left Turbinates: Enlarged, swollen and pale.     Right Sinus: No maxillary sinus tenderness or frontal sinus tenderness.     Left Sinus: No maxillary sinus tenderness or frontal sinus tenderness.     Mouth/Throat:     Mouth: Mucous membranes are not pale and not dry.     Pharynx: Uvula midline.  Eyes:     General:        Right eye: No discharge.        Left eye: No discharge.     Conjunctiva/sclera: Conjunctivae normal.     Right eye: Right conjunctiva is not injected. No chemosis.    Left eye: Left conjunctiva is not injected. No chemosis.    Pupils: Pupils are equal, round, and reactive to light.  Cardiovascular:     Rate and Rhythm: Normal rate and regular rhythm.     Heart sounds: Normal heart sounds.  Pulmonary:     Effort: Pulmonary effort is normal. No tachypnea, accessory muscle usage or respiratory distress.     Breath sounds: Normal breath sounds. No wheezing, rhonchi or rales.     Comments: Moving air well in all lung fields. No crackles or wheezes noted.  Chest:     Chest wall: No tenderness.  Lymphadenopathy:     Head:      Right side of head: No submandibular, tonsillar or occipital adenopathy.     Left side of head: No submandibular, tonsillar or occipital adenopathy.     Cervical: No cervical adenopathy.  Skin:    General: Skin is warm.     Capillary Refill: Capillary refill takes less than 2 seconds.     Coloration: Skin is not pale.     Findings: No abrasion, erythema, petechiae or rash. Rash is not papular, urticarial or vesicular.     Comments: No eczematous or urticarial lesions noted.   Neurological:     Mental Status: He is alert.  Psychiatric:        Behavior: Behavior is cooperative.     Diagnostic studies:    Spirometry: results  abnormal (FEV1: 1.83/63%, FVC: 2.47/67%, FEV1/FVC: 74%).    Spirometry consistent with possible restrictive disease. Overall this looks much worse than before.   Allergy Studies: none         Salvatore Marvel, MD  [facility] [place] [facility] [place]

## 2021-10-18 NOTE — Patient Instructions (Addendum)
1. Mild intermittent asthma, uncomplicated - Lung testing looked lower today. - We are going to start you on a different inhaler today called AirDuo which contains a long acting albuterol and an inhaled steroid. - Sample provided.  - Daily controller medication(s): AirDuo 113/63mcg one puff twice daily (let's do through the year given the fact that you have year round symptoms) - Prior to physical activity: ProAir Digihaler 2 puffs 10-15 minutes before physical activity. - Rescue medications: ProAir Digihaler 4 puffs every 4-6 hours as needed - Asthma control goals:  * Full participation in all desired activities (may need albuterol before activity) * Albuterol use two time or less a week on average (not counting use with activity) * Cough interfering with sleep two time or less a month * Oral steroids no more than once a year * No hospitalizations  2. Seasonal and perennial allergic rhinitis (outdoor molds and dust mites) -  I sent in the Xyzal tablet to help with allergy symptoms. - Continue with the Flonase one spray per nostril daily.  - I think we can hold off on allergy shots for now if the medications are working well for you.  3. Return in about 6 months (around 04/17/2022).    Please inform us of any Emergency Department visits, hospitalizations, or changes in symptoms. Call us before going to the ED for breathing or allergy symptoms since we might be able to fit you in for a sick visit. Feel free to contact us anytime with any questions, problems, or concerns.  It was a pleasure to meet you today!  Websites that have reliable patient information: 1. American Academy of Asthma, Allergy, and Immunology: www.aaaai.org 2. Food Allergy Research and Education (FARE): foodallergy.org 3. Mothers of Asthmatics: http://www.asthmacommunitynetwork.org 4. American College of Allergy, Asthma, and Immunology: www.acaai.org   COVID-19 Vaccine Information can be found at:  [email or web address].uk For questions related to vaccine distribution or appointments, please email [email or web address] or call [phone].   We realize that you might be concerned about having an allergic reaction to the COVID19 vaccines. To help with that concern, WE ARE OFFERING THE COVID19 VACCINES IN OUR OFFICE! Ask the front desk for dates!     Like [place] on [place] and Instagram for our latest updates!      A healthy democracy works best when [employer] participate! Make sure you are registered to vote! If you have moved or changed any of your contact information, you will need to get this updated before voting!  In some cases, you MAY be able to register to vote online: [email or web address]    Control of [place] and fungi can grow on a variety of surfaces provided certain temperature and moisture conditions exist.  Outdoor molds grow on plants, decaying vegetation and soil.  The major outdoor mold, Alternaria and Cladosporium, are found in very high numbers during hot and dry conditions.  Generally, a late Summer - Fall peak is seen for common outdoor fungal spores.  Rain will temporarily lower outdoor mold spore count, but counts rise rapidly when the rainy period ends.  The most important indoor molds are Aspergillus and Penicillium.  Dark, humid and poorly ventilated basements are ideal sites for mold growth.  The next most common sites of mold growth are the bathroom and the kitchen.  Outdoor (Seasonal) Mold Control  Positive outdoor molds via skin testing: Alternaria, Cladosporium, Bipolaris (Helminthsporium), Drechslera (Curvalaria), and Mucor  Use air conditioning and keep windows closed Avoid exposure to decaying  vegetation. Avoid leaf raking. Avoid grain handling. Consider wearing a face mask if working in moldy areas.     Control of Dust Mite Allergen    Dust mites  play a major role in allergic asthma and rhinitis.  They occur in environments with high humidity wherever human skin is found.  Dust mites absorb humidity from the atmosphere (ie, they do not drink) and feed on organic matter (including shed human and animal skin).  Dust mites are a microscopic type of insect that you cannot see with the naked eye.  High levels of dust mites have been detected from mattresses, pillows, carpets, upholstered furniture, bed covers, clothes, soft toys and any woven material.  The principal allergen of the dust mite is found in its feces.  A gram of dust may contain 1,000 mites and 250,000 fecal particles.  Mite antigen is easily measured in the air during house cleaning activities.  Dust mites do not bite and do not cause harm to humans, other than by triggering allergies/asthma.    Ways to decrease your exposure to dust mites in your home:  Encase mattresses, box springs and pillows with a mite-impermeable barrier or cover   Wash sheets, blankets and drapes weekly in hot water (130 F) with detergent and dry them in a dryer on the hot setting.  Have the room cleaned frequently with a vacuum cleaner and a damp dust-mop.  For carpeting or rugs, vacuuming with a vacuum cleaner equipped with a high-efficiency particulate air (HEPA) filter.  The dust mite allergic individual should not be in a room which is being cleaned and should wait 1 hour after cleaning before going into the room. Do not sleep on upholstered furniture (eg, couches).   If possible removing carpeting, upholstered furniture and drapery from the home is ideal.  Horizontal blinds should be eliminated in the rooms where the person spends the most time (bedroom, study, television room).  Washable vinyl, roller-type shades are optimal. Remove all non-washable stuffed toys from the bedroom.  Wash stuffed toys weekly like sheets and blankets above.   Reduce indoor humidity to less than 50%.  Inexpensive humidity  monitors can be purchased at most hardware stores.  Do not use a humidifier as can make the problem worse and are not recommended.

## 2022-01-26 IMAGING — CR DG CHEST 2V
2 series · 2 of 2 positions shown · non-contrast
Comparison: October 14, 2016

CLINICAL DATA: RIGHT-sided chest pain and shortness of breath

EXAM:
CHEST - 2 VIEW

[w chest pa]
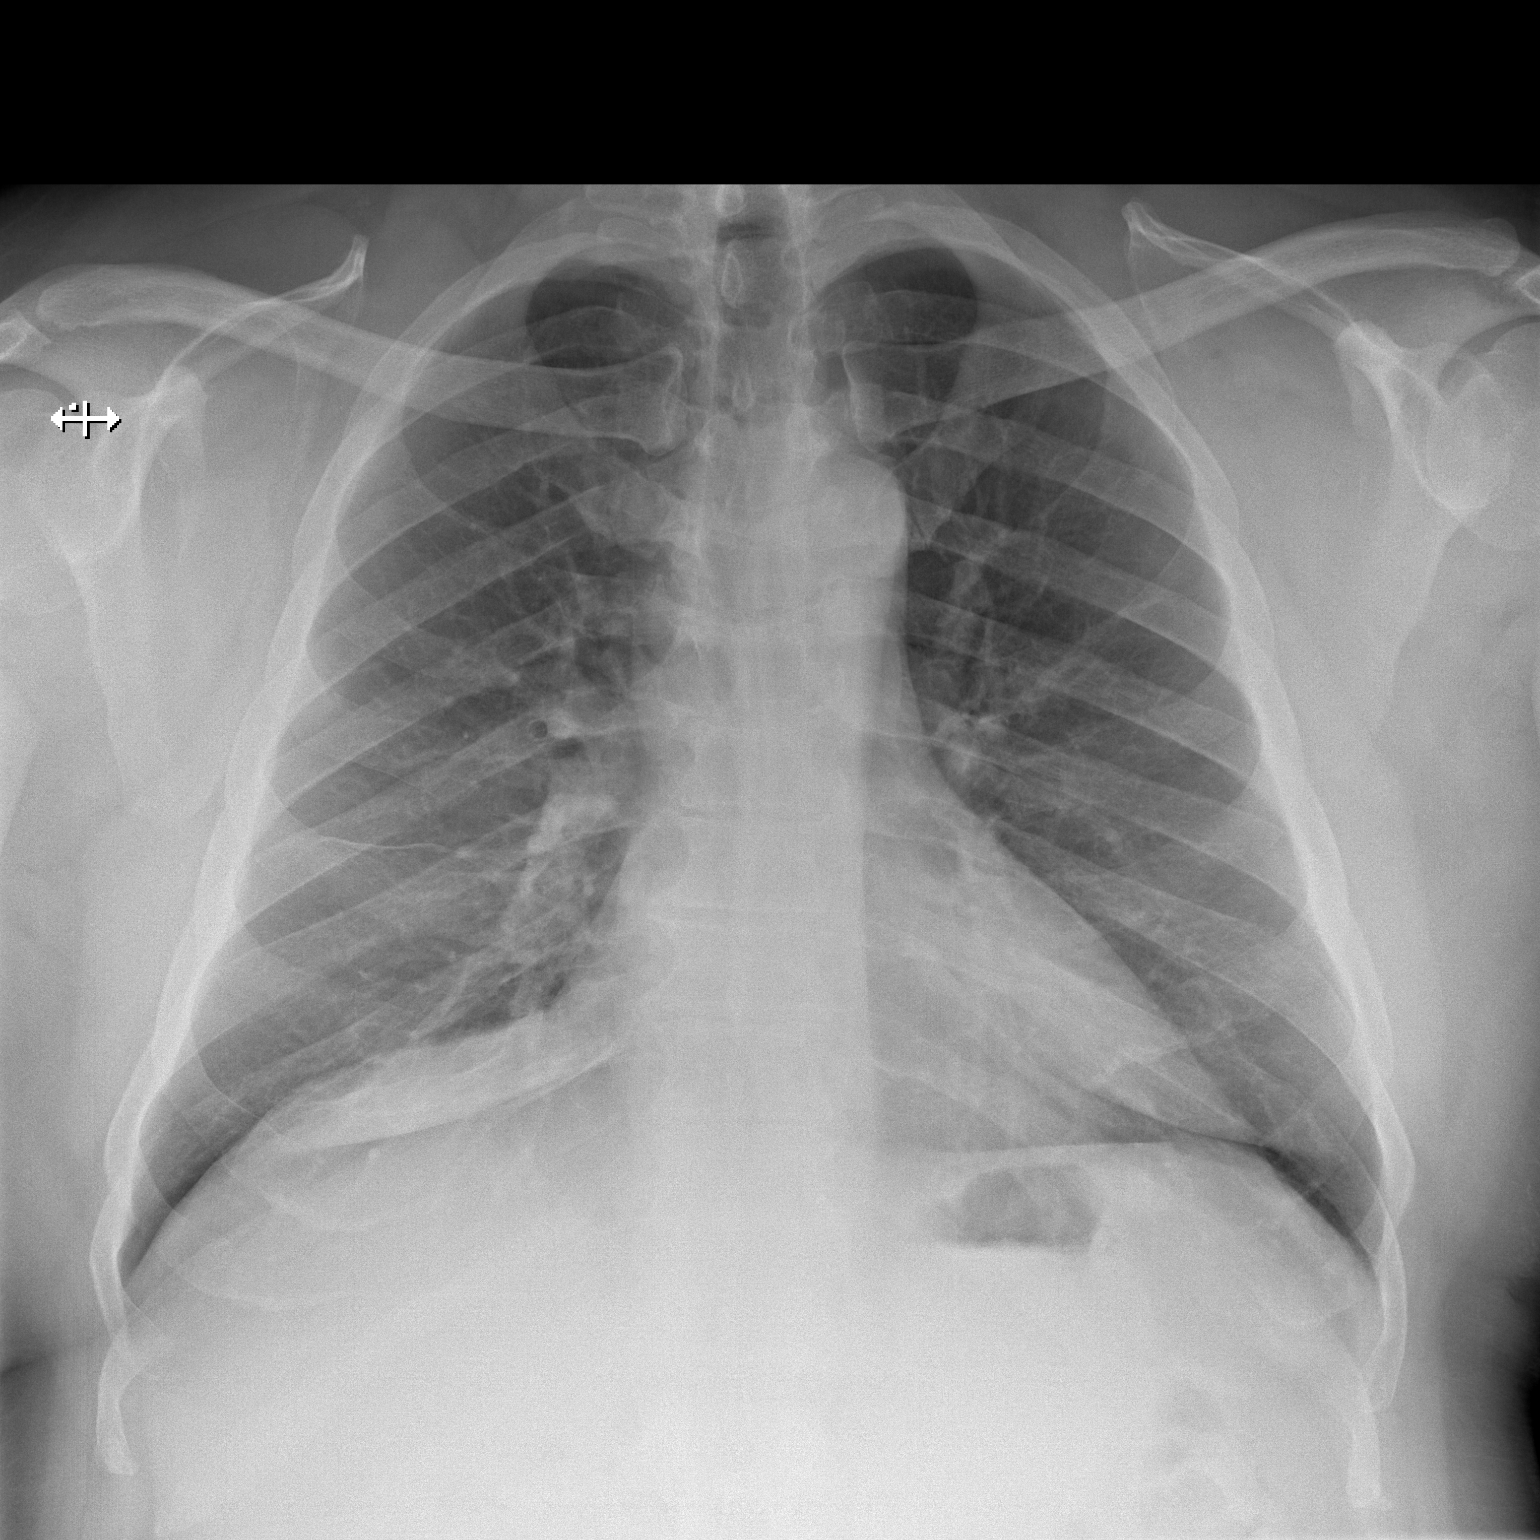

[w chest lat]
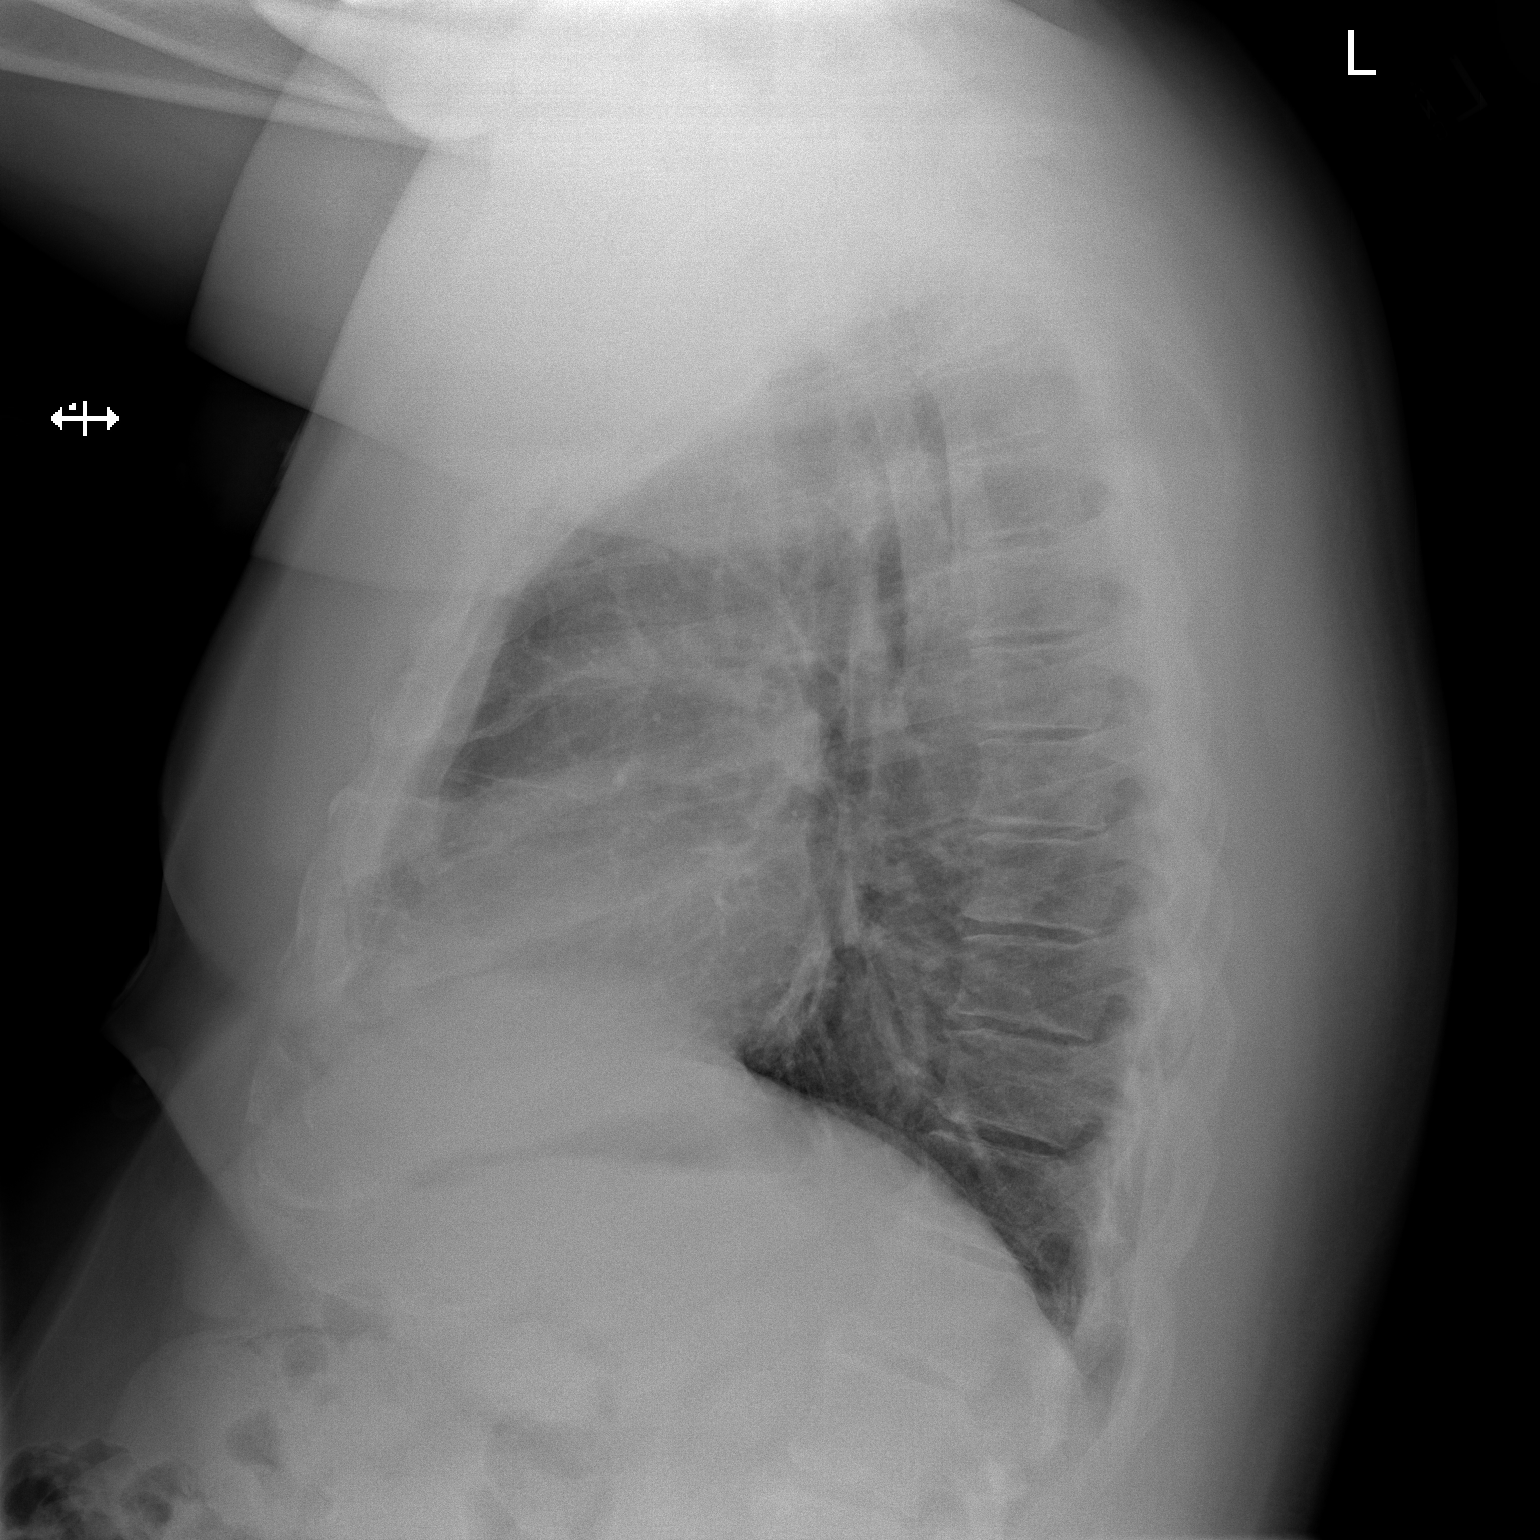

[2 of 2 positions shown; findings below may reference images not displayed]

FINDINGS: The cardiomediastinal silhouette is unchanged in contour.Unchanged
trace fluid in the RIGHT minor fissure. No pleural effusion. No
pneumothorax. No acute pleuroparenchymal abnormality. Visualized
abdomen is unremarkable. Multilevel degenerative changes of the
thoracic spine.
IMPRESSION: No acute cardiopulmonary abnormality.

## 2022-02-22 ENCOUNTER — Telehealth: Payer: Self-pay | Admitting: Allergy & Immunology

## 2022-02-22 MED ORDER — PROAIR DIGIHALER 108 (90 BASE) MCG/ACT IN AEPB: INHALATION_SPRAY | RESPIRATORY_TRACT | 2 refills | Status: DC

## 2022-02-22 MED ORDER — AIRDUO DIGIHALER 113-14 MCG/ACT IN AEPB
1.0000 | INHALATION_SPRAY | Freq: Two times a day (BID) | RESPIRATORY_TRACT | 5 refills | Status: DC
Start: 2022-02-22 — End: 2022-03-06

## 2022-02-28 NOTE — Telephone Encounter (Signed)
Great - thanks much!   Larry Alcock, MD [facility]  

## 2022-03-06 ENCOUNTER — Other Ambulatory Visit: Payer: Self-pay | Admitting: Allergy & Immunology

## 2022-03-06 ENCOUNTER — Telehealth: Payer: Self-pay

## 2022-03-06 MED ORDER — PROAIR DIGIHALER 108 (90 BASE) MCG/ACT IN AEPB: INHALATION_SPRAY | RESPIRATORY_TRACT | 2 refills | Status: DC

## 2022-03-06 MED ORDER — AIRDUO DIGIHALER 113-14 MCG/ACT IN AEPB: 1.0000 | INHALATION_SPRAY | Freq: Two times a day (BID) | RESPIRATORY_TRACT | 5 refills | Status: DC

## 2022-03-06 NOTE — Telephone Encounter (Signed)
Patient called and stated his insurance does not cover the Airduo nor the [employer] are covered under his insurance and asked if I could send them into his local pharmacy so he can use his GoodRx card. He has been out of his inhalers since last month 01/2022.  Sent script into his local pharmacy  [place] [phone]

## 2022-03-06 NOTE — Telephone Encounter (Signed)
[place] spoke to patient and he stated that he wanted this [facility] and he is going to use his [place] card.

## 2022-03-06 NOTE — Telephone Encounter (Signed)
Pts insurance does not cover airduo but covers dulera pelase advise to change

## 2022-03-07 ENCOUNTER — Other Ambulatory Visit: Payer: Self-pay

## 2022-03-07 MED ORDER — ALBUTEROL SULFATE HFA 108 (90 BASE) MCG/ACT IN AERS
2.0000 | INHALATION_SPRAY | RESPIRATORY_TRACT | 1 refills | Status: DC | PRN
Start: 2022-03-07 — End: 2023-02-13

## 2022-03-13 NOTE — Telephone Encounter (Signed)
Patient called office stating that [facility] told [facility] that they send back the medications to our office. He was willing to pay out pocket if he could afford. I told him, it is more likely they meant that your medications were not covered. I told him that I will check. So I have [facility] and was told AirDuo and ProAir were not cover at all under his insurance.   Please advise alternative.

## 2022-03-14 NOTE — Telephone Encounter (Addendum)
The following are covered under patient insurance:  Symbicort 80-4.5  Advair 44-21, 115-21, 230-21 Dulera 100-5, 200-5, 50-5 Fluticasone - Salmeterol 100-50, 250-50, 500-50

## 2022-03-14 NOTE — Telephone Encounter (Signed)
That is unfortunate. Let's try Symbicort 133mg two puffs BID.   JSalvatore Marvel MD [facility] [place] [place]

## 2022-03-15 ENCOUNTER — Other Ambulatory Visit: Payer: Self-pay | Admitting: Allergy & Immunology

## 2022-03-15 MED ORDER — BUDESONIDE-FORMOTEROL FUMARATE 80-4.5 MCG/ACT IN AERO: 2.0000 | INHALATION_SPRAY | Freq: Two times a day (BID) | RESPIRATORY_TRACT | 5 refills | Status: DC

## 2022-03-15 MED ORDER — FLUTICASONE-SALMETEROL 115-21 MCG/ACT IN AERO: 2.0000 | INHALATION_SPRAY | Freq: Two times a day (BID) | RESPIRATORY_TRACT | 5 refills | Status: DC

## 2022-03-15 MED ORDER — BUDESONIDE-FORMOTEROL FUMARATE 160-4.5 MCG/ACT IN AERO: 2.0000 | INHALATION_SPRAY | Freq: Two times a day (BID) | RESPIRATORY_TRACT | 5 refills | Status: DC

## 2022-03-15 NOTE — Telephone Encounter (Signed)
Well I recommended Symbicort 17 hours ago, so let's go with that one.   Symbicort 160/4.29mg two puffs BID.  JSalvatore Marvel MD [facility] [place]

## 2022-03-15 NOTE — Telephone Encounter (Signed)
I have sent over the Symbicort 80, however epic shows it's not covered as well. I will reach out to the patient and pharmacy on Monday to see if there were any issues with picking up the Symbicort.

## 2022-03-15 NOTE — Addendum Note (Signed)
Addended by: Isabel Caprice on: 03/15/2022 01:43 PM   Modules accepted: Orders

## 2022-03-15 NOTE — Telephone Encounter (Signed)
Ok let's do Advair 115/50mg two puffs BID.  JSalvatore Marvel MD [facility] [place]

## 2022-03-15 NOTE — Telephone Encounter (Signed)
Fine let's do the 40mg.   JSalvatore Marvel MD [facility] [place] [place]

## 2022-03-15 NOTE — Telephone Encounter (Signed)
Called and left patient a message to return our phone call concerning his inhaler.  Adam Sampson [phone]

## 2022-03-15 NOTE — Telephone Encounter (Signed)
Sorry Dr. Ernst Bowler the only inhalers that are covered are:  Mometasone Ruthe Mannan) 50-5 mcg Budesonide (Symbicort) 160-4.5 mcg Fluticasone (Advair) 45.21 mcg Fluticasone (Wixela Inhub) 100-50 mcg  Please let me know which to send in, thank you  [place] [phone]

## 2022-03-15 NOTE — Telephone Encounter (Signed)
The pharmacy stated Symbicort 160 is covered but when I go put it in the e-script it states 80 mcg is covered not 160 mcg.

## 2022-03-15 NOTE — Addendum Note (Signed)
Addended by: Isabel Caprice on: 03/15/2022 08:51 AM   Modules accepted: Orders

## 2022-03-15 NOTE — Addendum Note (Signed)
Addended by: Clovis Cao A on: 03/15/2022 04:58 PM   Modules accepted: Orders

## 2022-03-19 NOTE — Telephone Encounter (Signed)
Pharmacy requesting alternative - looks like PA is still going to be required.  Forwarding to provider for next step.

## 2022-03-20 MED ORDER — FLUTICASONE-SALMETEROL 100-50 MCG/ACT IN AEPB: 1.0000 | INHALATION_SPRAY | Freq: Two times a day (BID) | RESPIRATORY_TRACT | 5 refills | Status: DC

## 2022-03-20 NOTE — Telephone Encounter (Signed)
Called and spoke to patient and informed him that we would send in the [place] as that is the medication that seems to be covered through his insurance.

## 2022-03-20 NOTE — Telephone Encounter (Signed)
Patient called to check status of his Symbicort. He has not been able to pick up anything from the pharmacy. He states [facility] through [place].  Please advise, thanks!

## 2022-03-27 MED ORDER — FLUTICASONE-SALMETEROL 100-50 MCG/ACT IN AEPB: 1.0000 | INHALATION_SPRAY | Freq: Two times a day (BID) | RESPIRATORY_TRACT | 5 refills | Status: DC

## 2022-03-27 NOTE — Telephone Encounter (Signed)
We can change back to Wixela 135mg one puff twice daily.   [facility]   Thanks, JSalvatore Marvel MD All[facility] [place]

## 2022-03-27 NOTE — Telephone Encounter (Signed)
I resent in the [place] to [id number] [place] [place]. The patient was notified and confirmed the pharmacy.

## 2022-03-27 NOTE — Telephone Encounter (Signed)
Patient has not been able to get any inhaler and would like for our office to send in the [place] again.

## 2022-03-27 NOTE — Addendum Note (Signed)
Addended by: Larence Penning on: 03/27/2022 04:36 PM   Modules accepted: Orders

## 2022-04-18 ENCOUNTER — Ambulatory Visit: Payer: BC Managed Care – PPO | Admitting: Allergy & Immunology

## 2022-04-25 ENCOUNTER — Encounter: Payer: Self-pay | Admitting: Allergy & Immunology

## 2022-04-25 ENCOUNTER — Ambulatory Visit (INDEPENDENT_AMBULATORY_CARE_PROVIDER_SITE_OTHER): Payer: BC Managed Care – PPO | Admitting: Allergy & Immunology

## 2022-04-25 VITALS — BP 142/98 | HR 71 | Temp 97.8°F | Resp 16 | Ht 68.5 in | Wt 258.8 lb

## 2022-04-25 DIAGNOSIS — J302 Other seasonal allergic rhinitis: Secondary | ICD-10-CM

## 2022-04-25 DIAGNOSIS — J3089 Other allergic rhinitis: Secondary | ICD-10-CM

## 2022-04-25 DIAGNOSIS — J4540 Moderate persistent asthma, uncomplicated: Secondary | ICD-10-CM

## 2022-04-25 MED ORDER — LEVOCETIRIZINE DIHYDROCHLORIDE 5 MG PO TABS: 5.0000 mg | ORAL_TABLET | Freq: Every evening | ORAL | 5 refills | Status: DC

## 2022-04-25 MED ORDER — FLUTICASONE-SALMETEROL 100-50 MCG/ACT IN AEPB: 1.0000 | INHALATION_SPRAY | Freq: Two times a day (BID) | RESPIRATORY_TRACT | 5 refills | Status: DC

## 2022-04-25 MED ORDER — TRELEGY ELLIPTA 100-62.5-25 MCG/ACT IN AEPB: 1.0000 | INHALATION_SPRAY | Freq: Every day | RESPIRATORY_TRACT | 5 refills | Status: AC

## 2022-04-25 MED ORDER — FLUTICASONE PROPIONATE 50 MCG/ACT NA SUSP: 1.0000 | Freq: Two times a day (BID) | NASAL | 5 refills | Status: DC

## 2022-04-25 NOTE — Patient Instructions (Addendum)
1. Mild intermittent asthma, uncomplicated - Lung testing looked lower today. - We are starting on Trelegy one puff once daily (sent to the [facility]). - Daily controller medication(s): Trelegy 163mg one puff once daily.  - Prior to physical activity: albuterol 2 puffs 10-15 minutes before physical activity. - Rescue medications: albuterol 4 puffs every 4-6 hours as needed - Asthma control goals:  * Full participation in all desired activities (may need albuterol before activity) * Albuterol use two time or less a week on average (not counting use with activity) * Cough interfering with sleep two time or less a month * Oral steroids no more than once a year * No hospitalizations  2. Seasonal and perennial allergic rhinitis (outdoor molds and dust mites) - Restart levocetirizine 5 mg daily. - Restart fluticasone two sprays per nostril daily.  - I think we can hold off on allergy shots for now if the medications are working well for you.  3. Return in about 4 months (around 08/25/2022).    Please inform [place]of any Emergency Department visits, hospitalizations, or changes in symptoms. Call u[place]oreabefore going to the ED for breathing or allergy symptoms since we might be able to fit you in for a sick visit. Feel free to contact u[place]oreaanytime with any questions, problems, or concerns.  It was a pleasure to see you again today!  Websites that have reliable patient information: 1. American Academy of Asthma, Allergy, and Immunology: www.aaaai.org 2. Food Allergy Research and Education (FARE): foodallergy.org 3. Mothers of Asthmatics: http://www.asthmacommunitynetwork.org 4. American College of Allergy, Asthma, and Immunology: www.acaai.org   COVID-19 Vaccine Information can be found at: [email or web address] questions related to vaccine distribution or appointments, please email [email or web address] or call [phone]    We realize that you might be concerned about having an allergic reaction to the COVID19 vaccines. To help with that concern, WE ARE OFFERING THE COVID19 VACCINES IN OUR OFFICE! Ask the front desk for dates!     "Like" u[place]on Facebook and Instagram for our latest updates!      A healthy democracy works best when ANew York Life Insuranceparticipate! Make sure you are registered to vote! If you have moved or changed any of your contact information, you will need to get this updated before voting!  In some cases, you MAY be able to register to vote online: [email or web address]

## 2022-04-25 NOTE — Progress Notes (Signed)
FOLLOW UP  Date of Service/Encounter:  04/25/22   Assessment:   Moderate persistent asthma, uncomplicated - starting ICS/LABA   Seasonal and perennial allergic rhinitis (outdoor molds and dust mites)  Plan/Recommendations:   1. Mild intermittent asthma, uncomplicated - Lung testing looked lower today. - We are starting on Trelegy one puff once daily (sent to the [facility]). - Daily controller medication(s): Trelegy 127mg one puff once daily.  - Prior to physical activity: albuterol 2 puffs 10-15 minutes before physical activity. - Rescue medications: albuterol 4 puffs every 4-6 hours as needed - Asthma control goals:  * Full participation in all desired activities (may need albuterol before activity) * Albuterol use two time or less a week on average (not counting use with activity) * Cough interfering with sleep two time or less a month * Oral steroids no more than once a year * No hospitalizations  2. Seasonal and perennial allergic rhinitis (outdoor molds and dust mites) - Restart levocetirizine 5 mg daily. - Restart fluticasone two sprays per nostril daily.  - I think we can hold off on allergy shots for now if the medications are working well for you.  3. Return in about 4 months (around 08/25/2022).    Subjective:   Adam VICKERSis a 61y.o. male presenting today for follow up of  Chief Complaint  Patient presents with   Asthma    6 mth f/u - Patient states his asthma has not been good at ALL due to his insurance!!!!    Adam ORTNERhas a history of the following: Patient Active Problem List   Diagnosis Date Noted   Prostate cancer ([facility]oral 10/24/2016    History obtained from: chart review and patient.  AIlyasis a 61y.o. male presenting for a follow up visit.  He was last seen in February 2023.  At that time, his lung testing looked lower.  We started him on a different inhaler that week.  His insurance company would cover.  We also  continue with albuterol as needed.  For his allergic rhinitis, we sent in XBanksand continue with Flonase.  Since last visit, he has continued to have issues. He thinks that his medications are not being covered because he requested that we send them to a local pharmacy. He is supposed to getting it through [facility]  Regardless, he never contacted u[place]oreato let u[place]oreaknow about the meds that were not covered.  Asthma/Respiratory Symptom History: He has not had anything with regards to inhalers since the last visit. He is having problems with SOB on a daily basis. He wakes up in the middle of the night with SOB. He has not been to the ED for his symptoms. He does not feel that it has been too bad.  He has not been on prednisone, but he certainly has not felt great in 6 months.  Allergic Rhinitis Symptom History: He has been doing a lot of yard work. He had some trees one back.  He is not really doing a nose spray. He is not doing levocetirizine either.  From what ever reason, he never picked these up from the pharmacy and/or never called u[place]ato let u[place]now that these were not covered. He has not been on antibiotics at all.  His work is going well.  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  Otherwise, there have been no changes to  his past medical history, surgical history, family history, or social history.    Review of Systems  Constitutional: Negative.  Negative for fever, malaise/fatigue and weight loss.  HENT:  Positive for congestion. Negative for ear discharge and ear pain.        Positive for throat clearing.   Eyes:  Negative for pain, discharge and redness.  Respiratory:  Positive for cough, shortness of breath and wheezing. Negative for sputum production.   Cardiovascular: Negative.  Negative for chest pain and palpitations.  Gastrointestinal:  Negative for abdominal pain, heartburn, nausea and vomiting.  Skin: Negative.  Negative for  itching and rash.  Neurological:  Negative for dizziness and headaches.  Endo/Heme/Allergies:  Negative for environmental allergies. Does not bruise/bleed easily.       Objective:   Blood pressure (!) 142/98, pulse 71, temperature 97.8 F (36.6 C), resp. rate 16, height 5' 8.5" (1.74 m), weight 258 lb 12.8 oz (117.4 kg), SpO2 94 %. Body mass index is 38.78 kg/m.    Physical Exam Vitals reviewed.  Constitutional:      Appearance: Normal appearance. He is well-developed.     Comments: Very pleasant.   HENT:     Head: Normocephalic and atraumatic.     Right Ear: Tympanic membrane, ear canal and external ear normal. No drainage, swelling or tenderness. Tympanic membrane is not injected, scarred, erythematous, retracted or bulging.     Left Ear: Tympanic membrane, ear canal and external ear normal. No drainage, swelling or tenderness. Tympanic membrane is not injected, scarred, erythematous, retracted or bulging.     Nose: Mucosal edema present. No nasal deformity, septal deviation or rhinorrhea.     Right Turbinates: Enlarged, swollen and pale.     Left Turbinates: Enlarged, swollen and pale.     Right Sinus: No maxillary sinus tenderness or frontal sinus tenderness.     Left Sinus: No maxillary sinus tenderness or frontal sinus tenderness.     Comments: No nasal polyps.    Mouth/Throat:     Mouth: Mucous membranes are not pale and not dry.     Pharynx: Uvula midline.  Eyes:     General: Allergic shiner present.        Right eye: No discharge.        Left eye: No discharge.     Conjunctiva/sclera: Conjunctivae normal.     Right eye: Right conjunctiva is not injected. No chemosis.    Left eye: Left conjunctiva is not injected. No chemosis.    Pupils: Pupils are equal, round, and reactive to light.  Cardiovascular:     Rate and Rhythm: Normal rate and regular rhythm.     Heart sounds: Normal heart sounds.  Pulmonary:     Effort: Pulmonary effort is normal. No tachypnea,  accessory muscle usage or respiratory distress.     Breath sounds: Normal breath sounds. No wheezing, rhonchi or rales.     Comments: Moving air well in all lung fields. No crackles or wheezes noted.  Chest:     Chest wall: No tenderness.  Lymphadenopathy:     Head:     Right side of head: No submandibular, tonsillar or occipital adenopathy.     Left side of head: No submandibular, tonsillar or occipital adenopathy.     Cervical: No cervical adenopathy.  Skin:    General: Skin is warm.     Capillary Refill: Capillary refill takes less than 2 seconds.     Coloration: Skin is not pale.  Findings: No abrasion, erythema, petechiae or rash. Rash is not papular, urticarial or vesicular.     Comments: No eczematous or urticarial lesions noted.   Neurological:     Mental Status: He is alert.  Psychiatric:        Behavior: Behavior is cooperative.      Diagnostic studies:    Spirometry: results normal (FEV1: 1.90/65%, FVC: 2.33/62%, FEV1/FVC: 82%).    Spirometry consistent with possible restrictive disease.   Allergy Studies: none        Salvatore Marvel, MD  Allergy and [facility]

## 2022-04-30 ENCOUNTER — Ambulatory Visit: Payer: BC Managed Care – PPO | Admitting: Allergy & Immunology

## 2022-05-03 ENCOUNTER — Telehealth: Payer: Self-pay

## 2022-05-03 NOTE — Telephone Encounter (Signed)
Alvester Chou, Pharmacist/[employer] called in - DOB verified - wanting to verify whether or not patient was using Advair and Trelegy.   Advised patient is only using Trelegy Ellipta 100-62.5-25 MCG/ACT AEPB due to his insurance not covering Advair, AirDuo digihaler, Wixela inhub  or Symbicort.  Alvester Chou verbalized understanding, no further questions.

## 2022-08-29 ENCOUNTER — Ambulatory Visit: Payer: BC Managed Care – PPO | Admitting: Allergy & Immunology

## 2022-10-15 ENCOUNTER — Ambulatory Visit: Payer: BC Managed Care – PPO | Admitting: Allergy & Immunology

## 2022-10-18 ENCOUNTER — Ambulatory Visit: Payer: BC Managed Care – PPO | Admitting: Family Medicine

## 2022-12-13 ENCOUNTER — Emergency Department (HOSPITAL_COMMUNITY): Payer: BC Managed Care – PPO

## 2022-12-13 ENCOUNTER — Encounter (HOSPITAL_COMMUNITY): Payer: Self-pay

## 2022-12-13 ENCOUNTER — Emergency Department (HOSPITAL_COMMUNITY)
Admission: EM | Admit: 2022-12-13 | Discharge: 2022-12-13 | Disposition: A | Discharge status: Home / Self Care | Payer: BC Managed Care – PPO | Attending: Emergency Medicine | Admitting: Emergency Medicine

## 2022-12-13 DIAGNOSIS — Z8546 Personal history of malignant neoplasm of prostate: Secondary | ICD-10-CM | POA: Diagnosis not present

## 2022-12-13 DIAGNOSIS — N132 Hydronephrosis with renal and ureteral calculous obstruction: Secondary | ICD-10-CM | POA: Diagnosis not present

## 2022-12-13 DIAGNOSIS — Z79899 Other long term (current) drug therapy: Secondary | ICD-10-CM | POA: Diagnosis not present

## 2022-12-13 DIAGNOSIS — D72829 Elevated white blood cell count, unspecified: Secondary | ICD-10-CM | POA: Insufficient documentation

## 2022-12-13 DIAGNOSIS — I10 Essential (primary) hypertension: Secondary | ICD-10-CM | POA: Insufficient documentation

## 2022-12-13 DIAGNOSIS — R109 Unspecified abdominal pain: Secondary | ICD-10-CM | POA: Diagnosis present

## 2022-12-13 DIAGNOSIS — N200 Calculus of kidney: Secondary | ICD-10-CM

## 2022-12-13 HISTORY — DX: Malignant neoplasm of prostate: C61

## 2022-12-13 LAB — BASIC METABOLIC PANEL
Anion gap: 8 (ref 5–15)
BUN: 15 mg/dL (ref 6–20)
CO2: 26 mmol/L (ref 22–32)
Calcium: 9.2 mg/dL (ref 8.9–10.3)
Chloride: 103 mmol/L (ref 98–111)
Creatinine, Ser: 1.9 mg/dL — ABNORMAL HIGH (ref 0.61–1.24)
GFR, Estimated: 40 mL/min — ABNORMAL LOW (ref >60)
Glucose, Bld: 125 mg/dL — ABNORMAL HIGH (ref 70–99)
Potassium: 4 mmol/L (ref 3.5–5.1)
Sodium: 137 mmol/L (ref 135–145)

## 2022-12-13 LAB — CBC
HCT: 46.5 % (ref 39.0–52.0)
Hemoglobin: 14.8 g/dL (ref 13.0–17.0)
MCH: 27.4 pg (ref 26.0–34.0)
MCHC: 31.8 g/dL (ref 30.0–36.0)
MCV: 86.1 fL (ref 80.0–100.0)
Platelets: 361 10*3/uL (ref 150–400)
RBC: 5.4 MIL/uL (ref 4.22–5.81)
RDW: 14.7 % (ref 11.5–15.5)
WBC: 12.1 10*3/uL — ABNORMAL HIGH (ref 4.0–10.5)
nRBC: 0 % (ref 0.0–0.2)

## 2022-12-13 LAB — URINALYSIS, ROUTINE W REFLEX MICROSCOPIC
Bilirubin Urine: NEGATIVE
Glucose, UA: NEGATIVE mg/dL
Ketones, ur: NEGATIVE mg/dL
Nitrite: NEGATIVE
Protein, ur: 100 mg/dL — AB
RBC / HPF: >50 RBC/hpf (ref 0–5)
Specific Gravity, Urine: 1.025 (ref 1.005–1.030)
pH: 5 (ref 5.0–8.0)

## 2022-12-13 MED ORDER — MORPHINE SULFATE (PF) 4 MG/ML IV SOLN
4.0000 mg | Freq: Once | INTRAVENOUS | Status: AC
  Administered 2022-12-13: 4 mg via INTRAVENOUS
  Filled 2022-12-13: qty 1

## 2022-12-13 MED ORDER — ONDANSETRON HCL 4 MG/2ML IJ SOLN
4.0000 mg | Freq: Once | INTRAMUSCULAR | Status: AC
  Administered 2022-12-13: 4 mg via INTRAVENOUS
  Filled 2022-12-13: qty 2

## 2022-12-13 MED ORDER — TAMSULOSIN HCL 0.4 MG PO CAPS: 0.4000 mg | ORAL_CAPSULE | Freq: Every day | ORAL | 0 refills | Status: AC

## 2022-12-13 MED ORDER — HYDROMORPHONE HCL 1 MG/ML IJ SOLN
0.5000 mg | Freq: Once | INTRAMUSCULAR | Status: AC
  Administered 2022-12-13: 0.5 mg via INTRAVENOUS
  Filled 2022-12-13: qty 1

## 2022-12-13 MED ORDER — OXYCODONE-ACETAMINOPHEN 5-325 MG PO TABS: 1.0000 | ORAL_TABLET | Freq: Four times a day (QID) | ORAL | 0 refills | Status: AC | PRN

## 2022-12-13 MED ORDER — SODIUM CHLORIDE 0.9 % IV BOLUS
1000.0000 mL | Freq: Once | INTRAVENOUS | Status: AC
  Administered 2022-12-13: 1000 mL via INTRAVENOUS

## 2022-12-13 NOTE — ED Provider Notes (Signed)
[place] EMERGENCY DEPARTMENT AT [facility] Provider Note   CSN: [id number] Arrival date & time: 12/13/22  0153     History  Chief Complaint  Patient presents with   Flank Pain    Adam Sampson is a 62 y.o. male.  HPI     This is a 62 year old male who presents with left-sided flank pain.  Patient just returned from [facility].  He is a Naval architect.  He began to have some left flank pain yesterday but that resolved.  It recurred earlier today.  It radiates into the left lower quadrant.  Denies nausea or vomiting.  Does report hematuria but no dysuria.  No fevers.  Rates his pain at 10 out of 10.  He has not taken anything for the pain.  Home Medications Prior to Admission medications   Medication Sig Start Date End Date Taking? Authorizing Provider  carvedilol (COREG) 12.5 MG tablet Take 12.5 mg by mouth 2 (two) times daily. 05/19/20  Yes [provider]  fluticasone (FLONASE) 50 MCG/ACT nasal spray Place 1 spray into both nostrils in the morning and at bedtime.   Yes [provider]  naproxen sodium (ALEVE) 220 MG tablet Take 440 mg by mouth 2 (two) times daily as needed (headache/pain).   Yes [provider]  olmesartan (BENICAR) 40 MG tablet Take 40 mg by mouth daily.   Yes [provider]  oxyCODONE-acetaminophen (PERCOCET/ROXICET) 5-325 MG tablet Take 1 tablet by mouth every 6 (six) hours as needed for severe pain. 12/13/22  Yes Addis Bennie, Mayer Masker, MD  pantoprazole (PROTONIX) 40 MG tablet Take 40 mg by mouth daily. 05/19/20  Yes [provider]  rosuvastatin (CRESTOR) 10 MG tablet Take 10 mg by mouth daily. 05/19/20  Yes [provider]  tamsulosin (FLOMAX) 0.4 MG CAPS capsule Take 1 capsule (0.4 mg total) by mouth daily. 12/13/22  Yes Anasia Agro, Mayer Masker, MD  TRELEGY ELLIPTA 100-62.5-25 MCG/ACT AEPB Inhale 1 puff into the lungs daily.   Yes [provider]  albuterol (VENTOLIN HFA) 108 (90  Base) MCG/ACT inhaler Inhale 2 puffs into the lungs every 4 (four) hours as needed for wheezing or shortness of breath. 4 puffs every 4-6 hours as needed Strength: 108 (90 Base) MCG/ACT Patient not taking: Reported on 12/13/2022 03/07/22   Alfonse Spruce, MD  fluticasone [facility]) 50 MCG/ACT nasal spray Place 1 spray into both nostrils in the morning and at bedtime. 04/25/22 05/25/22  Alfonse Spruce, MD  levocetirizine Elita Boone) 5 MG tablet Take 1 tablet (5 mg total) by mouth every evening. 04/25/22 05/25/22  Alfonse Spruce, MD      Allergies    Patient has no known allergies.    Review of Systems   Review of Systems  Constitutional:  Negative for fever.  Genitourinary:  Positive for flank pain and hematuria.  All other systems reviewed and are negative.   Physical Exam Updated Vital Signs BP (!) 165/95 (BP Location: Right Arm)   Pulse 79   Temp 97.9 F (36.6 C) (Oral)   Resp 19   Ht 1.727 m ( )   Wt 113.4 kg   SpO2 100%   BMI 38.01 kg/m  Physical Exam Vitals and nursing note reviewed.  Constitutional:      Appearance: He is well-developed. He is obese. He is not ill-appearing.  HENT:     Head: Normocephalic and atraumatic.  Eyes:     Pupils: Pupils are equal, round, and reactive  to light.  Cardiovascular:     Rate and Rhythm: Normal rate and regular rhythm.     Heart sounds: Normal heart sounds. No murmur heard. Pulmonary:     Effort: Pulmonary effort is normal. No respiratory distress.     Breath sounds: Normal breath sounds. No wheezing.  Abdominal:     General: Bowel sounds are normal.     Palpations: Abdomen is soft.     Tenderness: There is no abdominal tenderness. There is left CVA tenderness. There is no rebound.  Musculoskeletal:     Cervical back: Neck supple.  Lymphadenopathy:     Cervical: No cervical adenopathy.  Skin:    General: Skin is warm and dry.  Neurological:     Mental Status: He is alert and oriented to person, place, and  time.  Psychiatric:        Mood and Affect: Mood normal.     ED Results / Procedures / Treatments   Labs (all labs ordered are listed, but only abnormal results are displayed) Labs Reviewed  BASIC METABOLIC PANEL - Abnormal; Notable for the following components:      Result Value   Glucose, Bld 125 (*)    Creatinine, Ser 1.90 (*)    GFR, Estimated 40 (*)    All other components within normal limits  CBC - Abnormal; Notable for the following components:   WBC 12.1 (*)    All other components within normal limits  URINALYSIS, ROUTINE W REFLEX MICROSCOPIC - Abnormal; Notable for the following components:   APPearance CLOUDY (*)    Hgb urine dipstick LARGE (*)    Protein, ur 100 (*)    Leukocytes,Ua TRACE (*)    Bacteria, UA RARE (*)    All other components within normal limits    EKG None  Radiology CT RENAL STONE STUDY  Result Date: 12/13/2022 CLINICAL DATA:  Left flank pain EXAM: CT ABDOMEN AND PELVIS WITHOUT CONTRAST TECHNIQUE: Multidetector CT imaging of the abdomen and pelvis was performed following the standard protocol without IV contrast. RADIATION DOSE REDUCTION: This exam was performed according to the departmental dose-optimization program which includes automated exposure control, adjustment of the mA and/or kV according to patient size and/or use of iterative reconstruction technique. COMPARISON:  None Available. FINDINGS: Lower chest: No acute abnormality Hepatobiliary: Gallstones within the gallbladder measuring up to 1.9 cm. 11 mm low-density lesion in the left hepatic lobe likely cyst. Pancreas: No focal abnormality or ductal dilatation. Spleen: No focal abnormality.  Normal size. Adrenals/Urinary Tract: Mild left hydronephrosis due to punctate 1 mm left UVJ stone. Left perinephric stranding. No stones or hydronephrosis on the right. 16 mm low-density lesion in the lower pole of the right kidney, likely cyst. No follow-up imaging recommended. Adrenal glands and urinary  bladder unremarkable. Stomach/Bowel: Normal appendix. Stomach, large and small bowel grossly unremarkable. Vascular/Lymphatic: No evidence of aneurysm or adenopathy. Reproductive: No visible focal abnormality. Other: No free fluid or free air. Small umbilical hernia containing fat. Musculoskeletal: No acute bony abnormality. IMPRESSION: 1 mm left UVJ stone with mild left hydronephrosis and perinephric stranding. Electronically Signed   By: Charlett Nose M.D.   On: 12/13/2022 02:52    Procedures Procedures    Medications Ordered in ED Medications  sodium chloride 0.9 % bolus 1,000 mL (1,000 mLs Intravenous New Bag/Given 12/13/22 0246)  morphine (PF) 4 MG/ML injection 4 mg (4 mg Intravenous Given 12/13/22 0246)  ondansetron (ZOFRAN) injection 4 mg (4 mg Intravenous Given 12/13/22 0246)  HYDROmorphone (  DILAUDID) injection 0.5 mg (0.5 mg Intravenous Given 12/13/22 0331)    ED Course/ Medical Decision Making/ A&P Clinical Course as of 12/13/22 0439  Fri Dec 13, 2022  0425 On recheck, patient states he feels much better.  We discussed kidney stone management and following up with urology. [CH]  3664 Basic metabolic panel(!) [CH]    Clinical Course User Index [CH] Hajime Asfaw, Mayer Masker, MD                             Medical Decision Making Amount and/or Complexity of Data Reviewed Labs: ordered. Radiology: ordered.  Risk Prescription drug management.   This patient presents to the ED for concern of left flank pain, this involves an extensive number of treatment options, and is a complaint that carries with it a high risk of complications and morbidity.  I considered the following differential and admission for this acute, potentially life threatening condition.  The differential diagnosis includes kidney stone, UTI, diverticulitis, musculoskeletal etiology  MDM:    This is a 62 year old male who presents with left flank pain.  He is nontoxic and vital signs are reassuring.  History and  physical exam is most suggestive of likely kidney stone.  Patient was given pain and nausea medication as well as fluids.  Labs obtained and reviewed.  Urinalysis without obvious UTI.  Slight leukocytosis.  Creatinine 1.9.  Baseline 1.2.  Will avoid NSAIDs.  Patient had pain control while in the emergency room.  CT scan confirms 1 mm UVJ stone.  We discussed supportive measures.  Urology follow-up.  (Labs, imaging, consults)  Labs: I Ordered, and personally interpreted labs.  The pertinent results include: CBC, BMP, urinalysis  Imaging Studies ordered: I ordered imaging studies including CT stone study I independently visualized and interpreted imaging. I agree with the radiologist interpretation  Additional history obtained from chart review.  External records from outside source obtained and reviewed including prior evaluations  Cardiac Monitoring: The patient was maintained on a cardiac monitor.  If on the cardiac monitor, I personally viewed and interpreted the cardiac monitored which showed an underlying rhythm of: sinus rhythm  Reevaluation: After the interventions noted above, I reevaluated the patient and found that they have :improved  Social Determinants of Health: lives independently  Disposition:  d/c with urology f/u  Co morbidities that complicate the patient evaluation  Past Medical History:  Diagnosis Date   Cancer    prostate   GERD (gastroesophageal reflux disease)    Hypertension    Pneumonia    hx of as a child   Prostate cancer      Medicines Meds ordered this encounter  Medications   sodium chloride 0.9 % bolus 1,000 mL   morphine (PF) 4 MG/ML injection 4 mg   ondansetron (ZOFRAN) injection 4 mg   HYDROmorphone (DILAUDID) injection 0.5 mg   oxyCODONE-acetaminophen (PERCOCET/ROXICET) 5-325 MG tablet    Sig: Take 1 tablet by mouth every 6 (six) hours as needed for severe pain.    Dispense:  12 tablet    Refill:  0   tamsulosin (FLOMAX) 0.4 MG CAPS  capsule    Sig: Take 1 capsule (0.4 mg total) by mouth daily.    Dispense:  30 capsule    Refill:  0    I have reviewed the patients home medicines and have made adjustments as needed  Problem List / ED Course: Problem List Items Addressed This Visit  None Visit Diagnoses     Kidney stone    -  Primary   Relevant Medications   morphine (PF) 4 MG/ML injection 4 mg (Completed)   HYDROmorphone (DILAUDID) injection 0.5 mg (Completed)   oxyCODONE-acetaminophen (PERCOCET/ROXICET) 5-325 MG tablet                   Final Clinical Impression(s) / ED Diagnoses Final diagnoses:  Kidney stone    Rx / DC Orders ED Discharge Orders          Ordered    oxyCODONE-acetaminophen (PERCOCET/ROXICET) 5-325 MG tablet  Every 6 hours PRN        12/13/22 0437    tamsulosin (FLOMAX) 0.4 MG CAPS capsule  Daily        12/13/22 0437              Shon Baton, MD 12/13/22 [phone]

## 2022-12-13 NOTE — ED Triage Notes (Signed)
Pt arrived POV for left sided flank pain that radiates to left lower back and hematuria. Denies n/v/d. No hx of kidney stones. Hx of prostate cancer, has difficulty urinating. NAD noted at current, A&O x4.

## 2022-12-13 NOTE — Discharge Instructions (Signed)
You were seen today for flank pain.  You were found to have a kidney stone.  Take medications as prescribed.  Make sure that you are staying hydrated.  Follow-up with urology.  If you develop fever, worsening pain, any new or worsening symptoms, you should be reevaluated.  You should not drive while taking oxycodone.

## 2023-02-10 ENCOUNTER — Other Ambulatory Visit: Payer: Self-pay | Admitting: Allergy & Immunology

## 2023-02-13 ENCOUNTER — Encounter: Payer: Self-pay | Admitting: Internal Medicine

## 2023-02-13 ENCOUNTER — Ambulatory Visit (INDEPENDENT_AMBULATORY_CARE_PROVIDER_SITE_OTHER): Payer: BC Managed Care – PPO | Admitting: Internal Medicine

## 2023-02-13 ENCOUNTER — Encounter: Payer: Self-pay | Admitting: Allergy & Immunology

## 2023-02-13 ENCOUNTER — Ambulatory Visit (INDEPENDENT_AMBULATORY_CARE_PROVIDER_SITE_OTHER): Payer: BC Managed Care – PPO | Admitting: Allergy & Immunology

## 2023-02-13 ENCOUNTER — Other Ambulatory Visit: Payer: Self-pay

## 2023-02-13 VITALS — BP 130/70 | HR 91 | Ht 68.0 in | Wt 256.8 lb

## 2023-02-13 VITALS — BP 144/88 | HR 94 | Temp 98.7°F | Resp 14 | Ht 68.0 in | Wt 253.4 lb

## 2023-02-13 DIAGNOSIS — J302 Other seasonal allergic rhinitis: Secondary | ICD-10-CM

## 2023-02-13 DIAGNOSIS — J3089 Other allergic rhinitis: Secondary | ICD-10-CM

## 2023-02-13 DIAGNOSIS — G4733 Obstructive sleep apnea (adult) (pediatric): Secondary | ICD-10-CM

## 2023-02-13 DIAGNOSIS — J4540 Moderate persistent asthma, uncomplicated: Secondary | ICD-10-CM

## 2023-02-13 MED ORDER — LEVOCETIRIZINE DIHYDROCHLORIDE 5 MG PO TABS: 5.0000 mg | ORAL_TABLET | Freq: Every evening | ORAL | 5 refills | Status: AC

## 2023-02-13 MED ORDER — ALBUTEROL SULFATE HFA 108 (90 BASE) MCG/ACT IN AERS: 2.0000 | INHALATION_SPRAY | RESPIRATORY_TRACT | 1 refills | Status: DC | PRN

## 2023-02-13 MED ORDER — TRELEGY ELLIPTA 100-62.5-25 MCG/ACT IN AEPB: 1.0000 | INHALATION_SPRAY | Freq: Every day | RESPIRATORY_TRACT | 5 refills | Status: AC

## 2023-02-13 NOTE — Progress Notes (Signed)
FOLLOW UP  Date of Service/Encounter:  02/13/23   Assessment:   Moderate persistent asthma, uncomplicated - doing well on Trelegy   Seasonal and perennial allergic rhinitis (outdoor molds and dust mites)  OSA - per sleep study ordered by his employer  Plan/Recommendations:   1. Mild intermittent asthma, uncomplicated - Lung testing looked good today. - We are going to continue with the Trelegy.  - We do not do CPAP management, but we can send you to Pulmonology for that.  - We can call and see if we have last minute cancellation.  - Daily controller medication(s): Trelegy one puff once daily.  - Prior to physical activity: albuterol 2 puffs 10-15 minutes before physical activity. - Rescue medications: albuterol 4 puffs every 4-6 hours as needed - Asthma control goals:  * Full participation in all desired activities (may need albuterol before activity) * Albuterol use two time or less a week on average (not counting use with activity) * Cough interfering with sleep two time or less a month * Oral steroids no more than once a year * No hospitalizations  2. Seasonal and perennial allergic rhinitis (outdoor molds and dust mites) - Continue with levocetirizine 5 mg daily. - Continue with fluticasone two sprays per nostril daily.  - I think we can hold off on allergy shots for now if the medications are working well for you.  3. Return in about 6 months (around 08/15/2023).   Subjective:   Adam Sampson is a 62 y.o. male presenting today for follow up of  Chief Complaint  Patient presents with   Asthma   Other    Was put on a CPAP machine causes constant cough, allergy flare, and difficulty breathing. No issues sleeping wihtout it     Adam Sampson has a history of the following: Patient Active Problem List   Diagnosis Date Noted   Prostate cancer (HCC) 10/24/2016    History obtained from: chart review and patient.  Adam Sampson is a 62 y.o. male presenting for a  follow up visit.  He was last seen in August 2023.  At that time, his lung testing looked a lot lower.  We started him on Trelegy 1 puff once daily as well as albuterol as needed.  For his allergic rhinitis, we restarted the levocetirizine and the fluticasone.  We felt that we can hold off on allergy shots.  Since the last visit, he has mostly done well from an asthma perspective.   Asthma/Respiratory Symptom History: He ran out of Trelegy last week.  It is unclear who is filling it since we have not seen him since August 2023, but went over the case he has been doing well on this without the need for systemic prednisone.  He has not been using albuterol.  He tells me that he never got the albuterol that was sent last time.  He denies any nighttime coughing or wheezing.  He has not been to the hospital for his breathing and has not been to urgent care either.  He is concerned today about his CPAP machine.  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  He ended up having numbers bad enough to qualify for a CPAP machine.  He got the CPAP machine and tried using it, but it was making coughing and sleeping worse.  He apparently is on administrative leave because he refuses to use it.  He tells me that he needs a note stating  that he does not need to use a CPAP machine.  He needs his know by Monday otherwise he is going to lose his job.  He does have a copy of the sleep report with him today. He goes in around 11am and gets done at 2am. Sometimes he does sleep in the truck [employer]).  He denies being sleepy during the job.  He has never fallen asleep at the wheel and has never been an accident.  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  He feels that he was only targeted because his neck was big.  Prostate cancer was cured as of 5 years ago. He did not need chemotherapy - only surgery.   Otherwise, there have been no  changes to his past medical history, surgical history, family history, or social history.    Review of Systems  Constitutional: Negative.  Negative for fever, malaise/fatigue and weight loss.  HENT:  Negative for congestion, ear discharge and ear pain.        Positive for throat clearing.   Eyes:  Negative for pain, discharge and redness.  Respiratory:  Negative for cough, sputum production, shortness of breath and wheezing.   Cardiovascular: Negative.  Negative for chest pain and palpitations.  Gastrointestinal:  Negative for abdominal pain, constipation, diarrhea, heartburn, nausea and vomiting.  Skin: Negative.  Negative for itching and rash.  Neurological:  Negative for dizziness and headaches.  Endo/Heme/Allergies:  Positive for environmental allergies. Does not bruise/bleed easily.       Objective:   Blood pressure (!) 144/88, pulse 94, temperature 98.7 F (37.1 C), resp. rate 14, height 5\' 8"  (1.727 m), weight 253 lb 6.4 oz (114.9 kg), SpO2 96 %. Body mass index is 38.53 kg/m.    Physical Exam Vitals reviewed.  Constitutional:      General: He is awake.     Appearance: Normal appearance. He is well-developed.  HENT:     Head: Normocephalic and atraumatic.     Right Ear: Tympanic membrane, ear canal and external ear normal. No drainage, swelling or tenderness. Tympanic membrane is not injected, scarred, erythematous, retracted or bulging.     Left Ear: Tympanic membrane, ear canal and external ear normal. No drainage, swelling or tenderness. Tympanic membrane is not injected, scarred, erythematous, retracted or bulging.     Nose: Mucosal edema and rhinorrhea present. No nasal deformity or septal deviation.     Right Turbinates: Enlarged, swollen and pale.     Left Turbinates: Enlarged, swollen and pale.     Right Sinus: No maxillary sinus tenderness or frontal sinus tenderness.     Left Sinus: No maxillary sinus tenderness or frontal sinus tenderness.     Comments: No  nasal polyps.    Mouth/Throat:     Mouth: Mucous membranes are not pale and not dry.     Pharynx: Uvula midline.  Eyes:     General: Allergic shiner present.        Right eye: No discharge.        Left eye: No discharge.     Conjunctiva/sclera: Conjunctivae normal.     Right eye: Right conjunctiva is not injected. No chemosis.    Left eye: Left conjunctiva is not injected. No chemosis.    Pupils: Pupils are equal, round, and reactive to light.  Cardiovascular:     Rate and Rhythm: Normal rate and regular rhythm.     Heart sounds: Normal heart sounds.  Pulmonary:  Effort: Pulmonary effort is normal. No tachypnea, accessory muscle usage or respiratory distress.     Breath sounds: Normal breath sounds. No wheezing, rhonchi or rales.     Comments: Moving air well in all lung fields. No crackles or wheezes noted.  Chest:     Chest wall: No tenderness.  Lymphadenopathy:     Head:     Right side of head: No submandibular, tonsillar or occipital adenopathy.     Left side of head: No submandibular, tonsillar or occipital adenopathy.     Cervical: No cervical adenopathy.  Skin:    General: Skin is warm.     Capillary Refill: Capillary refill takes less than 2 seconds.     Coloration: Skin is not pale.     Findings: No abrasion, erythema, petechiae or rash. Rash is not papular, urticarial or vesicular.     Comments: No eczematous or urticarial lesions noted.   Neurological:     Mental Status: He is alert.  Psychiatric:        Behavior: Behavior is cooperative.      Diagnostic studies:    Spirometry: results normal (FEV1: 2.22/78%, FVC: 2.52/69%, FEV1/FVC: 88%).   Spirometry consistent with normal pattern.   Allergy Studies:  NONE        Malachi Bonds, MD  [facility]

## 2023-02-13 NOTE — Progress Notes (Signed)
02/13/23- 62 yoM never smoker for sleep evaluation with concern of OSA- having trouble with CPAP Medical problem list includes HTN, GERD, Prostate Cancer, Asthma, Allergic Rhinitis, Gout, Epworth score- Body weight today-  HST- Sleep Quality Report from [facility] 12/27/22- AHI (4%) 61/ hr, desat to 73%, 1 hr 43 min < 88%, Mean 89%,  CPAP machine provided by [employer] 5-20,  AirSense 11; 01/14/23-02/12/23 Compliance 13%, AHI 12.1/ hr    REMSAFE [id number] manages his CPAP. -----Received cpap machine a month ago was paid for by company he works for. States cpap machine is causing dry cough, sinus drainage- feels like pressure is too much-been adjusted several times He complains that wearing CPAP disrupts his sleep, aggravates his asthma and makes him overheated despite humidifier adjustment and running fans. He is pretty set against using CPAP, and says he slept better and felt beetter without it. Bedtime between 1:00AM and 3:30 AM, short sleep latency, nocturia x several, gets up bet 9:00AM and 10:30 AM. We discussed comfort measures, medical reasons for treating OSA, and treatment alternatives. His diagnostic AHI is higher than we usually expect an oral appliance to help, and he is too heavy for Inspire. He is amenable to trying pressure change to auto 5-15 and nasal pillows.  Prior to Admission medications   Medication Sig Start Date End Date Taking? Authorizing Provider  albuterol (VENTOLIN HFA) 108 (90 Base) MCG/ACT inhaler Inhale 2 puffs into the lungs every 4 (four) hours as needed for wheezing or shortness of breath. 02/13/23  Yes Alfonse Spruce, MD  carvedilol (COREG) 12.5 MG tablet Take 12.5 mg by mouth 2 (two) times daily. 05/19/20  Yes [provider]  fluticasone (FLONASE) 50 MCG/ACT nasal spray Place 1 spray into both nostrils in the morning and at bedtime.   Yes [provider]  levocetirizine (XYZAL) 5 MG tablet Take 1 tablet (5 mg total) by mouth  every evening. 02/13/23  Yes Alfonse Spruce, MD  naproxen sodium (ALEVE) 220 MG tablet Take 440 mg by mouth 2 (two) times daily as needed (headache/pain).   Yes [provider]  olmesartan (BENICAR) 40 MG tablet Take 40 mg by mouth daily.   Yes [provider]  oxyCODONE-acetaminophen (PERCOCET/ROXICET) 5-325 MG tablet Take 1 tablet by mouth every 6 (six) hours as needed for severe pain. 12/13/22  Yes Horton, Mayer Masker, MD  pantoprazole (PROTONIX) 40 MG tablet Take 40 mg by mouth daily. 05/19/20  Yes [provider]  rosuvastatin (CRESTOR) 10 MG tablet Take 10 mg by mouth daily. 05/19/20  Yes [provider]  tamsulosin (FLOMAX) 0.4 MG CAPS capsule Take 1 capsule (0.4 mg total) by mouth daily. 12/13/22  Yes Horton, Mayer Masker, MD  TRELEGY ELLIPTA 100-62.5-25 MCG/ACT AEPB Inhale 1 puff into the lungs daily. 02/13/23  Yes Alfonse Spruce, MD  fluticasone [facility]) 50 MCG/ACT nasal spray Place 1 spray into both nostrils in the morning and at bedtime. 04/25/22 05/25/22  Alfonse Spruce, MD   Past Medical History:  Diagnosis Date   Cancer [facility])    prostate   GERD (gastroesophageal reflux disease)    Hypertension    Pneumonia    hx of as a child   Prostate cancer [facility])    Past Surgical History:  Procedure Laterality Date   LYMPHADENECTOMY Bilateral 10/24/2016   Procedure: PELVIC LYMPHADENECTOMY;  Surgeon: Heloise Purpura, MD;  Location: [facility];  Service: Urology;  Laterality: Bilateral;   Right knee arthroscopy     ROBOT  ASSISTED LAPAROSCOPIC RADICAL PROSTATECTOMY N/A 10/24/2016   Procedure: XI ROBOTIC ASSISTED LAPAROSCOPIC RADICAL PROSTATECTOMY LEVEL 2;  Surgeon: Heloise Purpura, MD;  Location: [facility];  Service: Urology;  Laterality: N/A;   Vascectomy     1998   No family history on file. Social History   Socioeconomic History   Marital status: Divorced    Spouse name: Not on file   Number of children: Not on file   Years of education: Not on  file   Highest education level: Not on file  Occupational History   Not on file  Tobacco Use   Smoking status: Never   Smokeless tobacco: Former  Substance and Sexual Activity   Alcohol use: No   Drug use: No   Sexual activity: Yes  Other Topics Concern   Not on file  Social History Narrative   Not on file   Social Determinants of Health   Financial Resource Strain: Not on file  Food Insecurity: Not on file  Transportation Needs: Not on file  Physical Activity: Not on file  Stress: Not on file  Social Connections: Not on file  Intimate Partner Violence: Not on file   ROS-see HPI  + = positive Constitutional:    weight loss, night sweats, fevers, chills, fatigue, lassitude. HEENT:    headaches, difficulty swallowing, tooth/dental problems, sore throat,       sneezing, itching, ear ache, nasal congestion, post nasal drip, snoring CV:    chest pain, orthopnea, PND, swelling in lower extremities, anasarca,                      dizziness, palpitations Resp:   shortness of breath with exertion or at rest.                productive cough,   non-productive cough, coughing up of blood.              change in color of mucus.  wheezing.   Skin:    rash or lesions. GI:  No-   heartburn, indigestion, abdominal pain, nausea, vomiting, diarrhea,                 change in bowel habits, loss of appetite GU: dysuria, change in color of urine, no urgency or frequency.   flank pain. MS:   joint pain, stiffness, decreased range of motion, back pain. Neuro-     nothing unusual Psych:  change in mood or affect.  depression or anxiety.   memory loss.  OBJ- Physical Exam General- Alert, Oriented, Affect-appropriate, Distress- none acute, +obese Skin- rash-none, lesions- none, excoriation- none Lymphadenopathy- none Head- atraumatic            Eyes- Gross vision intact, PERRLA, conjunctivae and secretions clear            Ears- Hearing, canals-normal            Nose- Clear, no-Septal dev,  mucus, polyps, erosion, perforation             Throat- Mallampati IV , mucosa clear , drainage- none, tonsils- atrophic, +teeth Neck- flexible , trachea midline, no stridor , thyroid nl, carotid no bruit Chest - symmetrical excursion , unlabored           Heart/CV- RRR , no murmur , no gallop  , no rub, nl s1 s2                           -  JVD- none , edema- none, stasis changes- none, varices- none           Lung- clear to P&A, wheeze- none, cough- none , dullness-none, rub- none           Chest wall-  Abd-  Br/ Gen/ Rectal- Not done, not indicated Extrem- cyanosis- none, clubbing, none, atrophy- none, strength- nl Neuro- grossly intact to observation

## 2023-02-13 NOTE — Patient Instructions (Addendum)
1. Mild intermittent asthma, uncomplicated - Lung testing looked good today. - We are going to continue with the Trelegy.  - We do not do CPAP management, but we can send you to Pulmonology for that.  - We can call and see if we have last minute cancellation.  - Daily controller medication(s): Trelegy one puff once daily.  - Prior to physical activity: albuterol 2 puffs 10-15 minutes before physical activity. - Rescue medications: albuterol 4 puffs every 4-6 hours as needed - Asthma control goals:  * Full participation in all desired activities (may need albuterol before activity) * Albuterol use two time or less a week on average (not counting use with activity) * Cough interfering with sleep two time or less a month * Oral steroids no more than once a year * No hospitalizations  2. Seasonal and perennial allergic rhinitis (outdoor molds and dust mites) - Continue with levocetirizine 5 mg daily. - Continue with fluticasone two sprays per nostril daily.  - I think we can hold off on allergy shots for now if the medications are working well for you.  3. Return in about 6 months (around 08/15/2023).    Please inform us of any Emergency Department visits, hospitalizations, or changes in symptoms. Call us before going to the ED for breathing or allergy symptoms since we might be able to fit you in for a sick visit. Feel free to contact us anytime with any questions, problems, or concerns.  It was a pleasure to see you again today!  Websites that have reliable patient information: 1. American Academy of Asthma, Allergy, and Immunology: www.aaaai.org 2. Food Allergy Research and Education (FARE): foodallergy.org 3. Mothers of Asthmatics: http://www.asthmacommunitynetwork.org 4. American College of Allergy, Asthma, and Immunology: www.acaai.org   COVID-19 Vaccine Information can be found at: [email or web address].[email or web address] For questions  related to vaccine distribution or appointments, please email [email or web address] or call [phone].   We realize that you might be concerned about having an allergic reaction to the COVID19 vaccines. To help with that concern, WE ARE OFFERING THE COVID19 VACCINES IN OUR OFFICE! Ask the front desk for dates!     "Like" [place] on Facebook and Instagram for our latest updates!      A healthy democracy works best when [employer] participate! Make sure you are registered to vote! If you have moved or changed any of your contact information, you will need to get this updated before voting!  In some cases, you MAY be able to register to vote online: [email or web address]

## 2023-02-13 NOTE — Patient Instructions (Signed)
Order- to [facility] ( or My [facility]?)    [phone]     Change CPAP auto range to 5-15, mask of choice- suggest nasal pillows, humidifier, supplies, AirView/ card  Agree with your efforts to lose some weight. This might put you in range to be a candidate in the future for the Inspire implanted nerve stimulator therapy.  We will look for ways to help.

## 2023-02-18 ENCOUNTER — Encounter: Payer: Self-pay | Admitting: Internal Medicine

## 2023-02-18 DIAGNOSIS — G4733 Obstructive sleep apnea (adult) (pediatric): Secondary | ICD-10-CM | POA: Insufficient documentation

## 2023-02-18 NOTE — Assessment & Plan Note (Signed)
256 lbs on check in for this visit. Emphasize benefit for OSA control of attention to diet/ exercise.

## 2023-02-18 NOTE — Assessment & Plan Note (Signed)
Severe OSA, complicated by obesity. He indicates he is willing to give up his job, but hopefully we can get him more comfortable to make CPAP work. Plan- change to auto 5-15 and nasal pillow mask. Consider referral for CPAP desensitization.

## 2023-03-06 ENCOUNTER — Emergency Department (HOSPITAL_COMMUNITY): Payer: BC Managed Care – PPO

## 2023-03-06 ENCOUNTER — Other Ambulatory Visit: Payer: Self-pay

## 2023-03-06 ENCOUNTER — Encounter (HOSPITAL_COMMUNITY): Payer: Self-pay

## 2023-03-06 ENCOUNTER — Inpatient Hospital Stay (HOSPITAL_COMMUNITY)
Admission: EM | Admit: 2023-03-06 | Discharge: 2023-03-09 | DRG: 177 | Disposition: A | Discharge status: Home / Self Care | Payer: BC Managed Care – PPO | Attending: Family Medicine | Admitting: Family Medicine

## 2023-03-06 DIAGNOSIS — N179 Acute kidney failure, unspecified: Secondary | ICD-10-CM | POA: Diagnosis present

## 2023-03-06 DIAGNOSIS — J45909 Unspecified asthma, uncomplicated: Secondary | ICD-10-CM | POA: Insufficient documentation

## 2023-03-06 DIAGNOSIS — Z9079 Acquired absence of other genital organ(s): Secondary | ICD-10-CM

## 2023-03-06 DIAGNOSIS — Z7985 Long-term (current) use of injectable non-insulin antidiabetic drugs: Secondary | ICD-10-CM | POA: Diagnosis not present

## 2023-03-06 DIAGNOSIS — Z87891 Personal history of nicotine dependence: Secondary | ICD-10-CM

## 2023-03-06 DIAGNOSIS — U071 COVID-19: Principal | ICD-10-CM | POA: Diagnosis present

## 2023-03-06 DIAGNOSIS — J1282 Pneumonia due to coronavirus disease 2019: Secondary | ICD-10-CM | POA: Diagnosis present

## 2023-03-06 DIAGNOSIS — E785 Hyperlipidemia, unspecified: Secondary | ICD-10-CM | POA: Diagnosis present

## 2023-03-06 DIAGNOSIS — Z888 Allergy status to other drugs, medicaments and biological substances status: Secondary | ICD-10-CM | POA: Diagnosis not present

## 2023-03-06 DIAGNOSIS — K219 Gastro-esophageal reflux disease without esophagitis: Secondary | ICD-10-CM | POA: Diagnosis present

## 2023-03-06 DIAGNOSIS — I10 Essential (primary) hypertension: Secondary | ICD-10-CM | POA: Diagnosis present

## 2023-03-06 DIAGNOSIS — J9601 Acute respiratory failure with hypoxia: Secondary | ICD-10-CM | POA: Diagnosis present

## 2023-03-06 DIAGNOSIS — Z6838 Body mass index (BMI) 38.0-38.9, adult: Secondary | ICD-10-CM | POA: Diagnosis not present

## 2023-03-06 DIAGNOSIS — E669 Obesity, unspecified: Secondary | ICD-10-CM | POA: Diagnosis present

## 2023-03-06 DIAGNOSIS — M109 Gout, unspecified: Secondary | ICD-10-CM | POA: Diagnosis present

## 2023-03-06 DIAGNOSIS — Z79899 Other long term (current) drug therapy: Secondary | ICD-10-CM

## 2023-03-06 DIAGNOSIS — G4733 Obstructive sleep apnea (adult) (pediatric): Secondary | ICD-10-CM | POA: Diagnosis present

## 2023-03-06 DIAGNOSIS — Z8546 Personal history of malignant neoplasm of prostate: Secondary | ICD-10-CM | POA: Diagnosis not present

## 2023-03-06 DIAGNOSIS — Z7951 Long term (current) use of inhaled steroids: Secondary | ICD-10-CM | POA: Diagnosis not present

## 2023-03-06 LAB — COMPREHENSIVE METABOLIC PANEL
ALT: 36 U/L (ref 0–44)
AST: 34 U/L (ref 15–41)
Albumin: 3.1 g/dL — ABNORMAL LOW (ref 3.5–5.0)
Alkaline Phosphatase: 48 U/L (ref 38–126)
Anion gap: 8 (ref 5–15)
BUN: 28 mg/dL — ABNORMAL HIGH (ref 6–20)
CO2: 22 mmol/L (ref 22–32)
Calcium: 8 mg/dL — ABNORMAL LOW (ref 8.9–10.3)
Chloride: 102 mmol/L (ref 98–111)
Creatinine, Ser: 1.72 mg/dL — ABNORMAL HIGH (ref 0.61–1.24)
GFR, Estimated: 45 mL/min — ABNORMAL LOW (ref >60)
Glucose, Bld: 100 mg/dL — ABNORMAL HIGH (ref 70–99)
Potassium: 4.3 mmol/L (ref 3.5–5.1)
Sodium: 132 mmol/L — ABNORMAL LOW (ref 135–145)
Total Bilirubin: 0.8 mg/dL (ref 0.3–1.2)
Total Protein: 7 g/dL (ref 6.5–8.1)

## 2023-03-06 LAB — RESP PANEL BY RT-PCR (RSV, FLU A&B, COVID)  RVPGX2
Influenza A by PCR: NEGATIVE
Influenza B by PCR: NEGATIVE
Resp Syncytial Virus by PCR: NEGATIVE
SARS Coronavirus 2 by RT PCR: POSITIVE — AB

## 2023-03-06 LAB — URINALYSIS, ROUTINE W REFLEX MICROSCOPIC
Bacteria, UA: NONE SEEN
Bilirubin Urine: NEGATIVE
Glucose, UA: NEGATIVE mg/dL
Ketones, ur: 5 mg/dL — AB
Leukocytes,Ua: NEGATIVE
Nitrite: NEGATIVE
Protein, ur: 30 mg/dL — AB
Specific Gravity, Urine: 1.019 (ref 1.005–1.030)
pH: 5 (ref 5.0–8.0)

## 2023-03-06 LAB — CBC WITH DIFFERENTIAL/PLATELET
Abs Immature Granulocytes: 0.04 10*3/uL (ref 0.00–0.07)
Basophils Absolute: 0 10*3/uL (ref 0.0–0.1)
Basophils Relative: 0 %
Eosinophils Absolute: 0 10*3/uL (ref 0.0–0.5)
Eosinophils Relative: 0 %
HCT: 45 % (ref 39.0–52.0)
Hemoglobin: 14.3 g/dL (ref 13.0–17.0)
Immature Granulocytes: 1 %
Lymphocytes Relative: 19 %
Lymphs Abs: 1.5 10*3/uL (ref 0.7–4.0)
MCH: 27.4 pg (ref 26.0–34.0)
MCHC: 31.8 g/dL (ref 30.0–36.0)
MCV: 86.4 fL (ref 80.0–100.0)
Monocytes Absolute: 0.8 10*3/uL (ref 0.1–1.0)
Monocytes Relative: 10 %
Neutro Abs: 5.6 10*3/uL (ref 1.7–7.7)
Neutrophils Relative %: 70 %
Platelets: 243 10*3/uL (ref 150–400)
RBC: 5.21 MIL/uL (ref 4.22–5.81)
RDW: 15.3 % (ref 11.5–15.5)
WBC: 8 10*3/uL (ref 4.0–10.5)
nRBC: 0 % (ref 0.0–0.2)

## 2023-03-06 LAB — MAGNESIUM: Magnesium: 2.3 mg/dL (ref 1.7–2.4)

## 2023-03-06 LAB — LIPASE, BLOOD: Lipase: 33 U/L (ref 11–51)

## 2023-03-06 MED ORDER — FLUTICASONE PROPIONATE 50 MCG/ACT NA SUSP
1.0000 | NASAL | Status: DC
  Administered 2023-03-06 – 2023-03-09 (×6): 1 via NASAL
  Filled 2023-03-06: qty 16

## 2023-03-06 MED ORDER — NAPROXEN 250 MG PO TABS: 500.0000 mg | ORAL_TABLET | Freq: Two times a day (BID) | ORAL | Status: DC | PRN

## 2023-03-06 MED ORDER — SODIUM CHLORIDE 0.9 % IV BOLUS
1000.0000 mL | Freq: Once | INTRAVENOUS | Status: AC
  Administered 2023-03-06: 1000 mL via INTRAVENOUS

## 2023-03-06 MED ORDER — NAPROXEN SODIUM 220 MG PO TABS: 440.0000 mg | ORAL_TABLET | Freq: Two times a day (BID) | ORAL | Status: DC | PRN

## 2023-03-06 MED ORDER — DEXAMETHASONE 6 MG PO TABS
6.0000 mg | ORAL_TABLET | ORAL | Status: DC
  Administered 2023-03-07 – 2023-03-08 (×2): 6 mg via ORAL
  Filled 2023-03-06 (×2): qty 1

## 2023-03-06 MED ORDER — PANTOPRAZOLE SODIUM 40 MG PO TBEC
40.0000 mg | DELAYED_RELEASE_TABLET | Freq: Every day | ORAL | Status: DC
  Administered 2023-03-06 – 2023-03-09 (×4): 40 mg via ORAL
  Filled 2023-03-06 (×4): qty 1

## 2023-03-06 MED ORDER — IRBESARTAN 300 MG PO TABS
300.0000 mg | ORAL_TABLET | Freq: Every day | ORAL | Status: DC
  Administered 2023-03-06 – 2023-03-09 (×4): 300 mg via ORAL
  Filled 2023-03-06 (×4): qty 1

## 2023-03-06 MED ORDER — ROSUVASTATIN CALCIUM 10 MG PO TABS
10.0000 mg | ORAL_TABLET | Freq: Every day | ORAL | Status: DC
  Administered 2023-03-06 – 2023-03-09 (×4): 10 mg via ORAL
  Filled 2023-03-06 (×4): qty 1

## 2023-03-06 MED ORDER — ALBUTEROL SULFATE (2.5 MG/3ML) 0.083% IN NEBU: 3.0000 mL | INHALATION_SOLUTION | RESPIRATORY_TRACT | Status: DC | PRN

## 2023-03-06 MED ORDER — SODIUM CHLORIDE 0.9% FLUSH: 3.0000 mL | INTRAVENOUS | Status: DC | PRN

## 2023-03-06 MED ORDER — TAMSULOSIN HCL 0.4 MG PO CAPS
0.4000 mg | ORAL_CAPSULE | Freq: Every day | ORAL | Status: DC
  Administered 2023-03-06 – 2023-03-07 (×2): 0.4 mg via ORAL
  Filled 2023-03-06 (×3): qty 1

## 2023-03-06 MED ORDER — ONDANSETRON HCL 4 MG/2ML IJ SOLN
4.0000 mg | Freq: Once | INTRAMUSCULAR | Status: AC
  Administered 2023-03-06: 4 mg via INTRAVENOUS
  Filled 2023-03-06: qty 2

## 2023-03-06 MED ORDER — LEVOCETIRIZINE DIHYDROCHLORIDE 5 MG PO TABS: 5.0000 mg | ORAL_TABLET | Freq: Every evening | ORAL | Status: DC

## 2023-03-06 MED ORDER — SODIUM CHLORIDE 0.9 % IV SOLN
100.0000 mg | Freq: Every day | INTRAVENOUS | Status: AC
  Administered 2023-03-07 – 2023-03-08 (×2): 100 mg via INTRAVENOUS
  Filled 2023-03-06 (×3): qty 20

## 2023-03-06 MED ORDER — SODIUM CHLORIDE 0.9 % IV SOLN: 250.0000 mL | INTRAVENOUS | Status: DC | PRN

## 2023-03-06 MED ORDER — ZOLPIDEM TARTRATE 5 MG PO TABS: 5.0000 mg | ORAL_TABLET | Freq: Every evening | ORAL | Status: DC | PRN

## 2023-03-06 MED ORDER — ACETAMINOPHEN 650 MG RE SUPP: 650.0000 mg | Freq: Four times a day (QID) | RECTAL | Status: DC | PRN

## 2023-03-06 MED ORDER — FLUTICASONE FUROATE-VILANTEROL 100-25 MCG/ACT IN AEPB
1.0000 | INHALATION_SPRAY | Freq: Every day | RESPIRATORY_TRACT | Status: DC
  Administered 2023-03-07 – 2023-03-09 (×3): 1 via RESPIRATORY_TRACT
  Filled 2023-03-06: qty 28

## 2023-03-06 MED ORDER — ENOXAPARIN SODIUM 40 MG/0.4ML IJ SOSY
40.0000 mg | PREFILLED_SYRINGE | INTRAMUSCULAR | Status: DC
  Administered 2023-03-06 – 2023-03-08 (×3): 40 mg via SUBCUTANEOUS
  Filled 2023-03-06 (×3): qty 0.4

## 2023-03-06 MED ORDER — CARVEDILOL 3.125 MG PO TABS
12.5000 mg | ORAL_TABLET | Freq: Two times a day (BID) | ORAL | Status: DC
  Administered 2023-03-06 – 2023-03-09 (×6): 12.5 mg via ORAL
  Filled 2023-03-06 (×6): qty 4

## 2023-03-06 MED ORDER — DEXAMETHASONE SODIUM PHOSPHATE 10 MG/ML IJ SOLN
8.0000 mg | Freq: Once | INTRAMUSCULAR | Status: AC
  Administered 2023-03-06: 8 mg via INTRAVENOUS
  Filled 2023-03-06: qty 1

## 2023-03-06 MED ORDER — SODIUM CHLORIDE 0.9 % IV SOLN
200.0000 mg | Freq: Once | INTRAVENOUS | Status: AC
  Administered 2023-03-06: 200 mg via INTRAVENOUS
  Filled 2023-03-06: qty 40

## 2023-03-06 MED ORDER — ACETAMINOPHEN 325 MG PO TABS: 650.0000 mg | ORAL_TABLET | Freq: Four times a day (QID) | ORAL | Status: DC | PRN

## 2023-03-06 MED ORDER — LORATADINE 10 MG PO TABS
10.0000 mg | ORAL_TABLET | Freq: Every day | ORAL | Status: DC
  Administered 2023-03-06 – 2023-03-08 (×3): 10 mg via ORAL
  Filled 2023-03-06 (×3): qty 1

## 2023-03-06 MED ORDER — UMECLIDINIUM BROMIDE 62.5 MCG/ACT IN AEPB
1.0000 | INHALATION_SPRAY | Freq: Every day | RESPIRATORY_TRACT | Status: DC
  Administered 2023-03-07 – 2023-03-09 (×3): 1 via RESPIRATORY_TRACT
  Filled 2023-03-06: qty 7

## 2023-03-06 MED ORDER — SODIUM CHLORIDE 0.9% FLUSH
3.0000 mL | Freq: Two times a day (BID) | INTRAVENOUS | Status: DC
  Administered 2023-03-06 – 2023-03-08 (×5): 3 mL via INTRAVENOUS

## 2023-03-06 NOTE — Plan of Care (Signed)
  Problem: Education: Goal: Knowledge of risk factors and measures for prevention of condition will improve Outcome: Progressing   Problem: Coping: Goal: Psychosocial and spiritual needs will be supported Outcome: Progressing   Problem: Respiratory: Goal: Will maintain a patent airway Outcome: Progressing   Problem: Clinical Measurements: Goal: Ability to maintain clinical measurements within normal limits will improve Outcome: Progressing   Problem: Activity: Goal: Risk for activity intolerance will decrease Outcome: Progressing   Problem: Nutrition: Goal: Adequate nutrition will be maintained Outcome: Progressing

## 2023-03-06 NOTE — Progress Notes (Signed)
   03/06/23 2244  BiPAP/CPAP/SIPAP  BiPAP/CPAP/SIPAP Pt Type Adult  BiPAP/CPAP/SIPAP Resmed (Pts. own machine)  Mask Type Nasal pillows (Pts. own)  Respiratory Rate 16 breaths/min  Flow Rate 2 lpm  Heater Temperature  (S/W added to humidifier)  Patient Home Equipment Yes  Auto Titrate Yes  CPAP/SIPAP surface wiped down Yes  Safety Check Completed by RT for Home Unit Yes, no issues noted

## 2023-03-06 NOTE — Assessment & Plan Note (Signed)
Patient covid positive, cxr c/w viral pneumonia, no leukocytosis, new oxygen requirement for hypoxemia. Additional risk factors include asthma,obesity. In ED he received first dose of dexamthazone  Plan Inpatient admit  Supplemental oxygen to keep O2 sat> 90%, including >90% with ambulation  Dexamethazone 6 mg every day x 3 days  Remdesivir 200 mg IV x 1 then 100 mg IV daily x 2 days

## 2023-03-06 NOTE — Assessment & Plan Note (Signed)
Patient on multi-drug therapy, including ARB. Serum creatinine at 1.72 similar to level 12/13/22.   Plan Continue home regimen

## 2023-03-06 NOTE — H&P (Signed)
History and Physical    DYLLON FREIERMUTH ZOX:[id number] DOB: Sep 05, 1960 DOA: 03/06/2023  DOS: the patient was seen and examined on 03/06/2023  PCP: Tally Joe, MD   Patient coming from: Home  I have personally briefly reviewed patient's old medical records in [facility]  Mr. Gersh, a 62 y/o with h/o prostate canceer s/p prostatectomy, Asthma with no h/o intubation, OSA on CPAP, HTN, HLD reports a 5-7 day h/o fevers, cough - productive of scant sputum, malaise and fatigue and increased SOB. He presents to [facility] for evaluation.    ED Course: T 99.5  120/66  HR 88 RR 18. Patient hypoxemic with ambulation - 79% RA. Lab: Cr 1.72, BUN28  WBC 8.0 w/ 70/19/10, CXR with patchy infrahilar and bibasilar air-space disease c/w viral infection. In ED he was started on supplemental oxygen and given dose of dexamethazone. TRH called to admit for continue treatment for moderate level Covid 19 pneumonia with risk factors of obesity, OSA and asthma and new oxygen requirement.   Review of Systems:  Review of Systems  Constitutional:  Positive for fever and malaise/fatigue. Negative for chills, diaphoresis and weight loss.  HENT: Negative.    Eyes: Negative.   Respiratory:  Positive for cough, shortness of breath and wheezing.   Cardiovascular:  Negative for chest pain, palpitations and PND.  Gastrointestinal: Negative.   Genitourinary: Negative.   Musculoskeletal:  Positive for myalgias.  Skin: Negative.   Neurological: Negative.   Endo/Heme/Allergies: Negative.   Psychiatric/Behavioral: Negative.      Past Medical History:  Diagnosis Date   Cancer [facility])    prostate   GERD (gastroesophageal reflux disease)    Hypertension    Pneumonia    hx of as a child   Prostate cancer [facility])     Past Surgical History:  Procedure Laterality Date   LYMPHADENECTOMY Bilateral 10/24/2016   Procedure: PELVIC LYMPHADENECTOMY;  Surgeon: Heloise Purpura, MD;  Location: [facility];  Service: Urology;  Laterality:  Bilateral;   Right knee arthroscopy     ROBOT ASSISTED LAPAROSCOPIC RADICAL PROSTATECTOMY N/A 10/24/2016   Procedure: XI ROBOTIC ASSISTED LAPAROSCOPIC RADICAL PROSTATECTOMY LEVEL 2;  Surgeon: Heloise Purpura, MD;  Location: [facility];  Service: Urology;  Laterality: N/A;   Vascectomy     1998   Soc Hx  - married 15 years, divorced. Had two sons: one son killed in MVA at age 8, son age 65. Patient lives alone. Works as a Holiday representative. He was vaccinated but did not have booster.     reports that he has never smoked. He has quit using smokeless tobacco. He reports that he does not drink alcohol and does not use drugs.  Allergies  Allergen Reactions   Amlodipine Besylate Swelling    History reviewed. No pertinent family history.  Prior to Admission medications   Medication Sig Start Date End Date Taking? Authorizing Provider  albuterol (VENTOLIN HFA) 108 (90 Base) MCG/ACT inhaler Inhale 2 puffs into the lungs every 4 (four) hours as needed for wheezing or shortness of breath. 02/13/23   Alfonse Spruce, MD  carvedilol (COREG) 12.5 MG tablet Take 12.5 mg by mouth 2 (two) times daily. 05/19/20   [provider]  fluticasone (FLONASE) 50 MCG/ACT nasal spray Place 1 spray into both nostrils in the morning and at bedtime.    [provider]  levocetirizine (XYZAL) 5 MG tablet Take 1 tablet (5 mg total) by mouth every evening. 02/13/23   Alfonse Spruce, MD  naproxen sodium (ALEVE) 220 MG tablet Take 440 mg by mouth 2 (two) times daily as needed (headache/pain).    [provider]  olmesartan (BENICAR) 40 MG tablet Take 40 mg by mouth daily.    [provider]  oxyCODONE-acetaminophen (PERCOCET/ROXICET) 5-325 MG tablet Take 1 tablet by mouth every 6 (six) hours as needed for severe pain. 12/13/22   Horton, Mayer Masker, MD  OZEMPIC, 0.25 OR 0.5 MG/DOSE, 2 MG/3ML SOPN Inject 0.25 mg into the skin every 7 (seven) days.    [provider]   pantoprazole (PROTONIX) 40 MG tablet Take 40 mg by mouth daily. 05/19/20   [provider]  rosuvastatin (CRESTOR) 10 MG tablet Take 10 mg by mouth daily. 05/19/20   [provider]  tamsulosin (FLOMAX) 0.4 MG CAPS capsule Take 1 capsule (0.4 mg total) by mouth daily. 12/13/22   Horton, Mayer Masker, MD  TRELEGY ELLIPTA 100-62.5-25 MCG/ACT AEPB Inhale 1 puff into the lungs daily. 02/13/23   Alfonse Spruce, MD    Physical Exam: Vitals:   03/06/23 1545 03/06/23 1600 03/06/23 1605 03/06/23 1629  BP:  126/79    Pulse: 94 90  89  Resp: (!) 25 14  (!) 26  Temp:   99.5 F (37.5 C)   TempSrc:   Oral   SpO2: (!) 82% 93%  94%  Weight:      Height:        Physical Exam Vitals and nursing note reviewed.  Constitutional:      General: He is not in acute distress.    Appearance: He is obese. He is not ill-appearing.  HENT:     Head: Normocephalic and atraumatic.     Mouth/Throat:     Mouth: Mucous membranes are moist.     Pharynx: No oropharyngeal exudate.  Eyes:     Extraocular Movements: Extraocular movements intact.     Conjunctiva/sclera: Conjunctivae normal.     Pupils: Pupils are equal, round, and reactive to light.  Cardiovascular:     Rate and Rhythm: Normal rate and regular rhythm.     Pulses: Normal pulses.     Heart sounds: Normal heart sounds.  Pulmonary:     Effort: Pulmonary effort is normal. No respiratory distress.     Breath sounds: Normal breath sounds. No wheezing, rhonchi or rales.  Abdominal:     General: Bowel sounds are normal.     Palpations: Abdomen is soft.  Musculoskeletal:        General: Normal range of motion.     Cervical back: Normal range of motion and neck supple.  Lymphadenopathy:     Cervical: No cervical adenopathy.  Skin:    General: Skin is warm and dry.  Neurological:     General: No focal deficit present.     Mental Status: He is alert and oriented to person, place, and time.     Cranial Nerves: No cranial nerve  deficit.  Psychiatric:        Mood and Affect: Mood normal.        Behavior: Behavior normal.      Labs on Admission: I have personally reviewed following labs and imaging studies  CBC: Recent Labs  Lab 03/06/23 1305  WBC 8.0  NEUTROABS 5.6  HGB 14.3  HCT 45.0  MCV 86.4  PLT 243   Basic Metabolic Panel: Recent Labs  Lab 03/06/23 1355  NA 132*  K 4.3  CL 102  CO2 22  GLUCOSE 100*  BUN  28*  CREATININE 1.72*  CALCIUM 8.0*  MG 2.3   GFR: Estimated Creatinine Clearance: 55.8 mL/min (A) (by C-G formula based on SCr of 1.72 mg/dL (H)). Liver Function Tests: Recent Labs  Lab 03/06/23 1355  AST 34  ALT 36  ALKPHOS 48  BILITOT 0.8  PROT 7.0  ALBUMIN 3.1*   Recent Labs  Lab 03/06/23 1355  LIPASE 33   No results for input(s): "AMMONIA" in the last 168 hours. Coagulation Profile: No results for input(s): "INR", "PROTIME" in the last 168 hours. Cardiac Enzymes: No results for input(s): "CKTOTAL", "CKMB", "CKMBINDEX", "TROPONINI" in the last 168 hours. BNP (last 3 results) No results for input(s): "PROBNP" in the last 8760 hours. HbA1C: No results for input(s): "HGBA1C" in the last 72 hours. CBG: No results for input(s): "GLUCAP" in the last 168 hours. Lipid Profile: No results for input(s): "CHOL", "HDL", "LDLCALC", "TRIG", "CHOLHDL", "LDLDIRECT" in the last 72 hours. Thyroid Function Tests: No results for input(s): "TSH", "T4TOTAL", "FREET4", "T3FREE", "THYROIDAB" in the last 72 hours. Anemia Panel: No results for input(s): "VITAMINB12", "FOLATE", "FERRITIN", "TIBC", "IRON", "RETICCTPCT" in the last 72 hours. Urine analysis:    Component Value Date/Time   COLORURINE YELLOW 03/06/2023 1241   APPEARANCEUR HAZY (A) 03/06/2023 1241   LABSPEC 1.019 03/06/2023 1241   PHURINE 5.0 03/06/2023 1241   GLUCOSEU NEGATIVE 03/06/2023 1241   HGBUR SMALL (A) 03/06/2023 1241   BILIRUBINUR NEGATIVE 03/06/2023 1241   KETONESUR 5 (A) 03/06/2023 1241   PROTEINUR 30 (A)  03/06/2023 1241   NITRITE NEGATIVE 03/06/2023 1241   LEUKOCYTESUR NEGATIVE 03/06/2023 1241    Radiological Exams on Admission: I have personally reviewed images DG Chest Port 1 View  Result Date: 03/06/2023 CLINICAL DATA:  cough EXAM: PORTABLE CHEST - 1 VIEW COMPARISON:  06/11/2020 FINDINGS: Relatively low lung volumes. Patchy infrahilar and bibasilar airspace opacities, new since previous. Heart size upper limits normal. No effusion. Visualized bones unremarkable. IMPRESSION: Patchy infrahilar and bibasilar airspace opacities. Electronically Signed   By: Corlis Leak M.D.   On: 03/06/2023 13:01    EKG: I have personally reviewed EKG: Sinus rhythm, LAD, no acute changes  Assessment/Plan Principal Problem:   Pneumonia due to COVID-19 virus Active Problems:   Asthma, chronic   HLD (hyperlipidemia)   HTN (hypertension)   OSA (obstructive sleep apnea)    Assessment and Plan: * Pneumonia due to COVID-19 virus Patient covid positive, cxr c/w viral pneumonia, no leukocytosis, new oxygen requirement for hypoxemia. Additional risk factors include asthma,obesity. In ED he received first dose of dexamthazone  Plan Inpatient admit  Supplemental oxygen to keep O2 sat> 90%, including >90% with ambulation  Dexamethazone 6 mg every day x 3 days  Remdesivir 200 mg IV x 1 then 100 mg IV daily x 2 days  Asthma, chronic Patient with h/o asthma using Trilepta inhaler daily and SABA prn. He denies any h/o intubation and his symptoms are generally well controlled.  Plan Continue home regimen  HTN (hypertension) Patient on multi-drug therapy, including ARB. Serum creatinine at 1.72 similar to level 12/13/22.   Plan Continue home regimen  HLD (hyperlipidemia) Tolerates statin therapy. No lipid panel in EPIC  Plan Continue home medications  Lipid panel in AM  OSA (obstructive sleep apnea) Last Pulmonary visit 02/14/23 with Dr. Fannie Knee. Patient switched to lower pressure CPAP with nasal cushions.  He reports he has been tolerating this tx.  Plan May use equipment from home at home settings       DVT  prophylaxis: Lovenox Code Status: Full Code Family Communication: patient has communicated with his son and did not request a follow-up call  Disposition Plan: home when medically stable  Consults called: none  Admission status: Inpatient, Med-Surg   Illene Regulus, MD [facility] 03/06/2023, 4:53 PM

## 2023-03-06 NOTE — Assessment & Plan Note (Signed)
Last Pulmonary visit 02/14/23 with Dr. Fannie Knee. Patient switched to lower pressure CPAP with nasal cushions. He reports he has been tolerating this tx.  Plan May use equipment from home at home settings

## 2023-03-06 NOTE — Assessment & Plan Note (Signed)
Tolerates statin therapy. No lipid panel in EPIC  Plan Continue home medications  Lipid panel in AM

## 2023-03-06 NOTE — ED Notes (Signed)
Patient ambulated down hallway with pulse ox. Patient reports increased SOB. O2 78-82% on RA. Patient placed on 2 L of O2 when back to room. PA made aware.

## 2023-03-06 NOTE — ED Notes (Addendum)
ED TO INPATIENT HANDOFF REPORT  Name/Age/Gender Adam Sampson 62 y.o. male  Code Status    Code Status Orders  (From admission, onward)           Start     Ordered   03/06/23 1646  Full code  Continuous       Question:  By:  Answer:  Consent: discussion documented in EHR   03/06/23 1653           Code Status History     Date Active Date Inactive Code Status Order ID Comments User Context   10/24/2016 1741 10/25/2016 1642 Full Code [id number]  Heloise Purpura, MD Inpatient       Home/SNF/Other Home  Chief Complaint Pneumonia due to COVID-19 virus [U07.1, J12.82]  Level of Care/Admitting Diagnosis ED Disposition     ED Disposition  Admit   Condition  --   Comment  Hospital Area: [facility] [[id number]]  Level of Care: Med-Surg [16]  May admit patient to [facility] or [facility] if equivalent level of care is available:: No  Covid Evaluation: Confirmed COVID Positive  Diagnosis: Pneumonia due to COVID-19 virus [[id number]]  Admitting Physician: Jacques Navy [[id number]]  Attending Physician: Jacques Navy [[id number]]  Certification:: I certify this patient will need inpatient services for at least 2 midnights  Estimated Length of Stay: 3          Medical History Past Medical History:  Diagnosis Date   Cancer (HCC)    prostate   GERD (gastroesophageal reflux disease)    Hypertension    Pneumonia    hx of as a child   Prostate cancer (HCC)     Allergies Allergies  Allergen Reactions   Amlodipine Besylate Swelling and Other (See Comments)    Gums swell    IV Location/Drains/Wounds Patient Lines/Drains/Airways Status     Active Line/Drains/Airways     Name Placement date Placement time Site Days   Peripheral IV 03/06/23 20 G 1.88" Left Antecubital 03/06/23  1333  Antecubital  less than 1            Labs/Imaging Results for orders placed or performed during the hospital encounter of 03/06/23 (from the past 48  hour(s))  Urinalysis, Routine w reflex microscopic -Urine, Clean Catch     Status: Abnormal   Collection Time: 03/06/23 12:41 PM  Result Value Ref Range   Color, Urine YELLOW YELLOW   APPearance HAZY (A) CLEAR   Specific Gravity, Urine 1.019 1.005 - 1.030   pH 5.0 5.0 - 8.0   Glucose, UA NEGATIVE NEGATIVE mg/dL   Hgb urine dipstick SMALL (A) NEGATIVE   Bilirubin Urine NEGATIVE NEGATIVE   Ketones, ur 5 (A) NEGATIVE mg/dL   Protein, ur 30 (A) NEGATIVE mg/dL   Nitrite NEGATIVE NEGATIVE   Leukocytes,Ua NEGATIVE NEGATIVE   RBC / HPF 0-5 0 - 5 RBC/hpf   WBC, UA 0-5 0 - 5 WBC/hpf   Bacteria, UA NONE SEEN NONE SEEN   Squamous Epithelial / HPF 0-5 0 - 5 /HPF   Mucus PRESENT     Comment: Performed at [facility], [address], [place], [place] [address]  CBC with Differential     Status: None   Collection Time: 03/06/23  1:05 PM  Result Value Ref Range   WBC 8.0 4.0 - 10.5 K/uL   RBC 5.21 4.22 - 5.81 MIL/uL   Hemoglobin 14.3 13.0 - 17.0 g/dL   HCT 21.3 08.6 -  52.0 %   MCV 86.4 80.0 - 100.0 fL   MCH 27.4 26.0 - 34.0 pg   MCHC 31.8 30.0 - 36.0 g/dL   RDW 81.1 91.4 - 78.2 %   Platelets 243 150 - 400 K/uL    Comment: SPECIMEN CHECKED FOR CLOTS REPEATED TO VERIFY    nRBC 0.0 0.0 - 0.2 %   Neutrophils Relative % 70 %   Neutro Abs 5.6 1.7 - 7.7 K/uL   Lymphocytes Relative 19 %   Lymphs Abs 1.5 0.7 - 4.0 K/uL   Monocytes Relative 10 %   Monocytes Absolute 0.8 0.1 - 1.0 K/uL   Eosinophils Relative 0 %   Eosinophils Absolute 0.0 0.0 - 0.5 K/uL   Basophils Relative 0 %   Basophils Absolute 0.0 0.0 - 0.1 K/uL   Immature Granulocytes 1 %   Abs Immature Granulocytes 0.04 0.00 - 0.07 K/uL    Comment: Performed at [facility], [address] [place], [place] [address]  Resp panel by RT-PCR (RSV, Flu A&B, Covid) Anterior Nasal Swab     Status: Abnormal   Collection Time: 03/06/23  1:08 PM   Specimen: Anterior Nasal Swab  Result Value Ref Range    SARS Coronavirus 2 by RT PCR POSITIVE (A) NEGATIVE    Comment: (NOTE) SARS-CoV-2 target nucleic acids are DETECTED.  The SARS-CoV-2 RNA is generally detectable in upper respiratory specimens during the acute phase of infection. Positive results are indicative of the presence of the identified virus, but do not rule out bacterial infection or co-infection with other pathogens not detected by the test. Clinical correlation with patient history and other diagnostic information is necessary to determine patient infection status. The expected result is Negative.  Fact Sheet for Patients: BloggerCourse.com  Fact Sheet for Healthcare Providers: SeriousBroker.it  This test is not yet approved or cleared by the [place] FDA and  has been authorized for detection and/or diagnosis of SARS-CoV-2 by FDA under an Emergency Use Authorization (EUA).  This EUA will remain in effect (meaning this test can be used) for the duration of  the COVID-19 declaration under Section 564(b)(1) of the A ct, 21 U.S.C. section 360bbb-3(b)(1), unless the authorization is terminated or revoked sooner.     Influenza A by PCR NEGATIVE NEGATIVE   Influenza B by PCR NEGATIVE NEGATIVE    Comment: (NOTE) The Xpert Xpress SARS-CoV-2/FLU/RSV plus assay is intended as an aid in the diagnosis of influenza from Nasopharyngeal swab specimens and should not be used as a sole basis for treatment. Nasal washings and aspirates are unacceptable for Xpert Xpress SARS-CoV-2/FLU/RSV testing.  Fact Sheet for Patients: BloggerCourse.com  Fact Sheet for Healthcare Providers: SeriousBroker.it  This test is not yet approved or cleared by the [place] FDA and has been authorized for detection and/or diagnosis of SARS-CoV-2 by FDA under an Emergency Use Authorization (EUA). This EUA will remain in effect (meaning this test  can be used) for the duration of the COVID-19 declaration under Section 564(b)(1) of the Act, 21 U.S.C. section 360bbb-3(b)(1), unless the authorization is terminated or revoked.     Resp Syncytial Virus by PCR NEGATIVE NEGATIVE    Comment: (NOTE) Fact Sheet for Patients: BloggerCourse.com  Fact Sheet for Healthcare Providers: SeriousBroker.it  This test is not yet approved or cleared by the [place] FDA and has been authorized for detection and/or diagnosis of SARS-CoV-2 by FDA under an Emergency Use Authorization (EUA). This EUA will remain in effect (meaning this test can  be used) for the duration of the COVID-19 declaration under Section 564(b)(1) of the Act, 21 U.S.C. section 360bbb-3(b)(1), unless the authorization is terminated or revoked.  Performed at [facility], [address], [place], [place] [address]   Comprehensive metabolic panel     Status: Abnormal   Collection Time: 03/06/23  1:55 PM  Result Value Ref Range   Sodium 132 (L) 135 - 145 mmol/L   Potassium 4.3 3.5 - 5.1 mmol/L   Chloride 102 98 - 111 mmol/L   CO2 22 22 - 32 mmol/L   Glucose, Bld 100 (H) 70 - 99 mg/dL    Comment: Glucose reference range applies only to samples taken after fasting for at least 8 hours.   BUN 28 (H) 6 - 20 mg/dL   Creatinine, Ser 8.11 (H) 0.61 - 1.24 mg/dL   Calcium 8.0 (L) 8.9 - 10.3 mg/dL   Total Protein 7.0 6.5 - 8.1 g/dL   Albumin 3.1 (L) 3.5 - 5.0 g/dL   AST 34 15 - 41 U/L   ALT 36 0 - 44 U/L   Alkaline Phosphatase 48 38 - 126 U/L   Total Bilirubin 0.8 0.3 - 1.2 mg/dL   GFR, Estimated 45 (L) >60 mL/min    Comment: (NOTE) Calculated using the CKD-EPI Creatinine Equation (2021)    Anion gap 8 5 - 15    Comment: Performed at [facility], [address], [place], [place] [address]  Lipase, blood     Status: None   Collection Time: 03/06/23  1:55 PM  Result Value Ref Range    Lipase 33 11 - 51 U/L    Comment: Performed at [facility], [address] [place], [place] [address]  Magnesium     Status: None   Collection Time: 03/06/23  1:55 PM  Result Value Ref Range   Magnesium 2.3 1.7 - 2.4 mg/dL    Comment: Performed at [facility], [address] [place], [place] [address]   DG Chest Port 1 View  Result Date: 03/06/2023 CLINICAL DATA:  cough EXAM: PORTABLE CHEST - 1 VIEW COMPARISON:  06/11/2020 FINDINGS: Relatively low lung volumes. Patchy infrahilar and bibasilar airspace opacities, new since previous. Heart size upper limits normal. No effusion. Visualized bones unremarkable. IMPRESSION: Patchy infrahilar and bibasilar airspace opacities. Electronically Signed   By: Corlis Leak M.D.   On: 03/06/2023 13:01    Pending Labs Unresulted Labs (From admission, onward)     Start     Ordered   03/13/23 0500  Creatinine, serum  (enoxaparin (LOVENOX)    CrCl >/= 30 ml/min)  Weekly,   R     Comments: while on enoxaparin therapy    03/06/23 1653   03/07/23 0500  Comprehensive metabolic panel  Tomorrow morning,   R        03/06/23 1653   03/07/23 0500  Lipid panel  Tomorrow morning,   R        03/06/23 1653   03/06/23 1647  HIV Antibody (routine testing w rflx)  (HIV Antibody (Routine testing w reflex) panel)  Add-on,   AD        03/06/23 1653            Vitals/Pain Today's Vitals   03/06/23 1545 03/06/23 1600 03/06/23 1605 03/06/23 1629  BP:  126/79    Pulse: 94 90  89  Resp: (!) 25 14  (!) 26  Temp:   99.5 F (37.5  C)   TempSrc:   Oral   SpO2: (!) 82% 93%  94%  Weight:      Height:        Isolation Precautions Airborne and Contact precautions  Medications Medications  carvedilol (COREG) tablet 12.5 mg (has no administration in time range)  irbesartan (AVAPRO) tablet 300 mg (has no administration in time range)  rosuvastatin (CRESTOR) tablet 10 mg (has no administration in time range)  pantoprazole  (PROTONIX) EC tablet 40 mg (has no administration in time range)  tamsulosin (FLOMAX) capsule 0.4 mg (has no administration in time range)  albuterol (PROVENTIL) (2.5 MG/3ML) 0.083% nebulizer solution 3 mL (has no administration in time range)  fluticasone (FLONASE) 50 MCG/ACT nasal spray 1 spray (has no administration in time range)  fluticasone furoate-vilanterol (BREO ELLIPTA) 100-25 MCG/ACT 1 puff (1 puff Inhalation Not Given 03/06/23 1648)    And  umeclidinium bromide (INCRUSE ELLIPTA) 62.5 MCG/ACT 1 puff (1 puff Inhalation Not Given 03/06/23 1648)  loratadine (CLARITIN) tablet 10 mg (has no administration in time range)  naproxen (NAPROSYN) tablet 500 mg (has no administration in time range)  enoxaparin (LOVENOX) injection 40 mg (has no administration in time range)  sodium chloride flush (NS) 0.9 % injection 3 mL (has no administration in time range)  sodium chloride flush (NS) 0.9 % injection 3 mL (has no administration in time range)  0.9 %  sodium chloride infusion (has no administration in time range)  acetaminophen (TYLENOL) tablet 650 mg (has no administration in time range)    Or  acetaminophen (TYLENOL) suppository 650 mg (has no administration in time range)  zolpidem (AMBIEN) tablet 5 mg (has no administration in time range)  dexamethasone (DECADRON) tablet 6 mg (has no administration in time range)  remdesivir 200 mg in sodium chloride 0.9% 250 mL IVPB (has no administration in time range)    Followed by  remdesivir 100 mg in sodium chloride 0.9 % 100 mL IVPB (has no administration in time range)  sodium chloride 0.9 % bolus 1,000 mL (0 mLs Intravenous Stopped 03/06/23 1547)  ondansetron (ZOFRAN) injection 4 mg (4 mg Intravenous Given 03/06/23 1337)  dexamethasone (DECADRON) injection 8 mg (8 mg Intravenous Given 03/06/23 1625)    Mobility walks

## 2023-03-06 NOTE — ED Triage Notes (Signed)
Patient has been nauseous along with no appetite for 1 week. Feeling weak.

## 2023-03-06 NOTE — Assessment & Plan Note (Signed)
Patient with h/o asthma using Trilepta inhaler daily and SABA prn. He denies any h/o intubation and his symptoms are generally well controlled.  Plan Continue home regimen

## 2023-03-06 NOTE — ED Provider Notes (Signed)
[place] EMERGENCY DEPARTMENT AT [facility] Provider Note   CSN: [id number] Arrival date & time: 03/06/23  1217     History  Chief Complaint  Patient presents with   Nausea    Adam Sampson is a 62 y.o. male.  With a history of sleep apnea, GERD, hypertension who presents to the ED for evaluation of nausea, fatigue, generalized bodyaches, cough, rhinorrhea, loss of appetite.  Symptoms have been present for the past 6 days and have progressively gotten worse.  He reports being started on a new CPAP machine lately but denies any new medications.  He is a [employer] but denies any abnormal travel.  He reports mild shortness of breath with ambulation.  His cough is occasionally productive of clear sputum.  He denies history of DVT or PE, unilateral leg swelling, long distance travel, recent cancer treatments, surgeries, hemoptysis, estrogen use.  He states he has been unable to eat or drink anything over the past few days.  He denies any vomiting but states that he just does not have an appetite.  He also reports intermittent subjective fevers. He denies smoking, recreational drug use, alcohol use. Unaware of any known sick contacts.   HPI     Home Medications Prior to Admission medications   Medication Sig Start Date End Date Taking? Authorizing Provider  albuterol (VENTOLIN HFA) 108 (90 Base) MCG/ACT inhaler Inhale 2 puffs into the lungs every 4 (four) hours as needed for wheezing or shortness of breath. 02/13/23   Alfonse Spruce, MD  carvedilol (COREG) 12.5 MG tablet Take 12.5 mg by mouth 2 (two) times daily. 05/19/20   [provider]  fluticasone (FLONASE) 50 MCG/ACT nasal spray Place 1 spray into both nostrils in the morning and at bedtime.    [provider]  levocetirizine (XYZAL) 5 MG tablet Take 1 tablet (5 mg total) by mouth every evening. 02/13/23   Alfonse Spruce, MD  naproxen sodium (ALEVE) 220 MG tablet Take 440 mg by mouth 2 (two)  times daily as needed (headache/pain).    [provider]  olmesartan (BENICAR) 40 MG tablet Take 40 mg by mouth daily.    [provider]  oxyCODONE-acetaminophen (PERCOCET/ROXICET) 5-325 MG tablet Take 1 tablet by mouth every 6 (six) hours as needed for severe pain. 12/13/22   Horton, Mayer Masker, MD  OZEMPIC, 0.25 OR 0.5 MG/DOSE, 2 MG/3ML SOPN Inject 0.25 mg into the skin every 7 (seven) days.    [provider]  pantoprazole (PROTONIX) 40 MG tablet Take 40 mg by mouth daily. 05/19/20   [provider]  rosuvastatin (CRESTOR) 10 MG tablet Take 10 mg by mouth daily. 05/19/20   [provider]  tamsulosin (FLOMAX) 0.4 MG CAPS capsule Take 1 capsule (0.4 mg total) by mouth daily. 12/13/22   Horton, Mayer Masker, MD  TRELEGY ELLIPTA 100-62.5-25 MCG/ACT AEPB Inhale 1 puff into the lungs daily. 02/13/23   Alfonse Spruce, MD      Allergies    Amlodipine besylate    Review of Systems   Review of Systems  Constitutional:  Positive for fatigue.  HENT:  Positive for rhinorrhea.   Respiratory:  Positive for cough.   All other systems reviewed and are negative.   Physical Exam Updated Vital Signs BP 126/79   Pulse 89   Temp 99.5 F (37.5 C) (Oral)   Resp (!) 26   Ht 5\' 8"  (1.727 m)   Wt 113.4 kg   SpO2  94%   BMI 38.01 kg/m  Physical Exam Vitals and nursing note reviewed.  Constitutional:      General: He is not in acute distress.    Appearance: He is well-developed.     Comments: Resting comfortably in bed  HENT:     Head: Normocephalic and atraumatic.  Eyes:     Conjunctiva/sclera: Conjunctivae normal.  Cardiovascular:     Rate and Rhythm: Normal rate and regular rhythm.     Heart sounds: No murmur heard. Pulmonary:     Effort: Pulmonary effort is normal. No respiratory distress.     Breath sounds: Normal breath sounds. No wheezing, rhonchi or rales.  Abdominal:     Palpations: Abdomen is soft.     Tenderness: There is no abdominal  tenderness. There is no guarding.  Musculoskeletal:        General: No swelling.     Cervical back: Neck supple.     Right lower leg: No edema.     Left lower leg: No edema.  Skin:    General: Skin is warm and dry.     Capillary Refill: Capillary refill takes less than 2 seconds.  Neurological:     General: No focal deficit present.     Mental Status: He is alert and oriented to person, place, and time.  Psychiatric:        Mood and Affect: Mood normal.        Behavior: Behavior normal.     ED Results / Procedures / Treatments   Labs (all labs ordered are listed, but only abnormal results are displayed) Labs Reviewed  RESP PANEL BY RT-PCR (RSV, FLU A&B, COVID)  RVPGX2 - Abnormal; Notable for the following components:      Result Value   SARS Coronavirus 2 by RT PCR POSITIVE (*)    All other components within normal limits  URINALYSIS, ROUTINE W REFLEX MICROSCOPIC - Abnormal; Notable for the following components:   APPearance HAZY (*)    Hgb urine dipstick SMALL (*)    Ketones, ur 5 (*)    Protein, ur 30 (*)    All other components within normal limits  COMPREHENSIVE METABOLIC PANEL - Abnormal; Notable for the following components:   Sodium 132 (*)    Glucose, Bld 100 (*)    BUN 28 (*)    Creatinine, Ser 1.72 (*)    Calcium 8.0 (*)    Albumin 3.1 (*)    GFR, Estimated 45 (*)    All other components within normal limits  CBC WITH DIFFERENTIAL/PLATELET  LIPASE, BLOOD  MAGNESIUM  HIV ANTIBODY (ROUTINE TESTING W REFLEX)    EKG None  Radiology DG Chest Port 1 View  Result Date: 03/06/2023 CLINICAL DATA:  cough EXAM: PORTABLE CHEST - 1 VIEW COMPARISON:  06/11/2020 FINDINGS: Relatively low lung volumes. Patchy infrahilar and bibasilar airspace opacities, new since previous. Heart size upper limits normal. No effusion. Visualized bones unremarkable. IMPRESSION: Patchy infrahilar and bibasilar airspace opacities. Electronically Signed   By: Corlis Leak M.D.   On: 03/06/2023  13:01    Procedures Procedures    Medications Ordered in ED Medications  carvedilol (COREG) tablet 12.5 mg (has no administration in time range)  irbesartan (AVAPRO) tablet 300 mg (has no administration in time range)  rosuvastatin (CRESTOR) tablet 10 mg (has no administration in time range)  pantoprazole (PROTONIX) EC tablet 40 mg (has no administration in time range)  tamsulosin (FLOMAX) capsule 0.4 mg (has no administration in time range)  albuterol (PROVENTIL) (2.5 MG/3ML) 0.083% nebulizer solution 3 mL (has no administration in time range)  fluticasone (FLONASE) 50 MCG/ACT nasal spray 1 spray (has no administration in time range)  fluticasone furoate-vilanterol (BREO ELLIPTA) 100-25 MCG/ACT 1 puff (1 puff Inhalation Not Given 03/06/23 1648)    And  umeclidinium bromide (INCRUSE ELLIPTA) 62.5 MCG/ACT 1 puff (1 puff Inhalation Not Given 03/06/23 1648)  loratadine (CLARITIN) tablet 10 mg (has no administration in time range)  naproxen (NAPROSYN) tablet 500 mg (has no administration in time range)  enoxaparin (LOVENOX) injection 40 mg (has no administration in time range)  sodium chloride flush (NS) 0.9 % injection 3 mL (has no administration in time range)  sodium chloride flush (NS) 0.9 % injection 3 mL (has no administration in time range)  0.9 %  sodium chloride infusion (has no administration in time range)  acetaminophen (TYLENOL) tablet 650 mg (has no administration in time range)    Or  acetaminophen (TYLENOL) suppository 650 mg (has no administration in time range)  zolpidem (AMBIEN) tablet 5 mg (has no administration in time range)  dexamethasone (DECADRON) tablet 6 mg (has no administration in time range)  remdesivir 200 mg in sodium chloride 0.9% 250 mL IVPB (has no administration in time range)    Followed by  remdesivir 100 mg in sodium chloride 0.9 % 100 mL IVPB (has no administration in time range)  sodium chloride 0.9 % bolus 1,000 mL (0 mLs Intravenous Stopped 03/06/23  1547)  ondansetron (ZOFRAN) injection 4 mg (4 mg Intravenous Given 03/06/23 1337)  dexamethasone (DECADRON) injection 8 mg (8 mg Intravenous Given 03/06/23 1625)    ED Course/ Medical Decision Making/ A&P Clinical Course as of 03/06/23 1656  Thu Mar 06, 2023  1556 Patient had approximately 80% pulse ox while ambulating.  90% on room air at rest. [AS]  1605 Spoke with hospitalist service who will admit [AS]    Clinical Course User Index [AS] Davis Vannatter, Edsel Petrin, PA-C                             Medical Decision Making Amount and/or Complexity of Data Reviewed Labs: ordered. Radiology: ordered.  Risk OTC drugs. Prescription drug management. Decision regarding hospitalization.  This patient presents to the ED for concern of fatigue, nausea, URI symptoms, this involves an extensive number of treatment options, and is a complaint that carries with it a high risk of complications and morbidity.  The differential diagnosis includes flu, COVID, RSV, other viral URI, pneumonia, bronchitis, PE  Co morbidities that complicate the patient evaluation  sleep apnea, GERD, hypertension  My initial workup includes labs, EKG, symptom control, imaging  Additional history obtained from: Nursing notes from this visit. Previous records within EMR system ED visit on 12/13/2022.  Weight remains unchanged since last visit  I ordered, reviewed and interpreted labs which include: CBC, CMP, lipase, urinalysis, respiratory panel  I ordered imaging studies including chest x-ray I independently visualized and interpreted imaging which showed bibasilar patchy opacities I agree with the radiologist interpretation  Consultations Obtained:  I requested consultation with the hospitalist Dr. Debby Bud,  and discussed lab and imaging findings as well as pertinent plan - they recommend: admission  Afebrile, hypoxic to 89% on room air but otherwise hemodynamically stable.  62 year old male presents to the ED for  evaluation of cough, congestion, shortness of breath, fatigue, nausea, poor intake over the past 6 days.  He is COVID-positive.  This  is likely the cause of his symptoms.  Chest x-ray revealed bibasilar patchy lower lobe infiltrates.  This is consistent with a viral pneumonia.  Patient ambulated in the ED with pulse ox and is found to be approximately 80% while ambulating.  He was at approximately 89 to 90% while on room air at rest as well.  Overall believe this is secondary to his COVID-pneumonia, however believe he would benefit from continued monitoring due to his hypoxia and new oxygen demand.  Patient is in agreement with this plan.  Stable at the time of admission.  Patient's case discussed with Dr. Eloise Harman who agrees with plan to discharge with follow-up.   Note: Portions of this report may have been transcribed using voice recognition software. Every effort was made to ensure accuracy; however, inadvertent computerized transcription errors may still be present.        Final Clinical Impression(s) / ED Diagnoses Final diagnoses:  COVID-19  Acute respiratory failure with hypoxia [facility])    Rx / DC Orders ED Discharge Orders     None         Mora Bellman 03/06/23 1656    Rondel Baton, MD 03/09/23 [phone]

## 2023-03-06 NOTE — Subjective & Objective (Signed)
Adam Sampson, a 62 y/o with h/o prostate canceer s/p prostatectomy, Asthma with no h/o intubation, OSA on CPAP, HTN, HLD reports a 5-7 day h/o fevers, cough - productive of scant sputum, malaise and fatigue and increased SOB. He presents to [facility]-ED for evaluation.

## 2023-03-07 DIAGNOSIS — J1282 Pneumonia due to coronavirus disease 2019: Secondary | ICD-10-CM | POA: Diagnosis not present

## 2023-03-07 DIAGNOSIS — U071 COVID-19: Secondary | ICD-10-CM | POA: Diagnosis not present

## 2023-03-07 LAB — LIPID PANEL
Cholesterol: 109 mg/dL (ref 0–200)
HDL: 34 mg/dL — ABNORMAL LOW (ref >40)
LDL Cholesterol: 58 mg/dL (ref 0–99)
Total CHOL/HDL Ratio: 3.2 RATIO
Triglycerides: 86 mg/dL (ref <150)
VLDL: 17 mg/dL (ref 0–40)

## 2023-03-07 LAB — COMPREHENSIVE METABOLIC PANEL
ALT: 36 U/L (ref 0–44)
AST: 34 U/L (ref 15–41)
Albumin: 3 g/dL — ABNORMAL LOW (ref 3.5–5.0)
Alkaline Phosphatase: 51 U/L (ref 38–126)
Anion gap: 7 (ref 5–15)
BUN: 24 mg/dL — ABNORMAL HIGH (ref 6–20)
CO2: 24 mmol/L (ref 22–32)
Calcium: 8.3 mg/dL — ABNORMAL LOW (ref 8.9–10.3)
Chloride: 103 mmol/L (ref 98–111)
Creatinine, Ser: 1.4 mg/dL — ABNORMAL HIGH (ref 0.61–1.24)
GFR, Estimated: 58 mL/min — ABNORMAL LOW (ref >60)
Glucose, Bld: 166 mg/dL — ABNORMAL HIGH (ref 70–99)
Potassium: 4.9 mmol/L (ref 3.5–5.1)
Sodium: 134 mmol/L — ABNORMAL LOW (ref 135–145)
Total Bilirubin: 0.6 mg/dL (ref 0.3–1.2)
Total Protein: 7.2 g/dL (ref 6.5–8.1)

## 2023-03-07 LAB — C-REACTIVE PROTEIN: CRP: 15.1 mg/dL — ABNORMAL HIGH (ref <1.0)

## 2023-03-07 LAB — D-DIMER, QUANTITATIVE: D-Dimer, Quant: 0.9 ug/mL-FEU — ABNORMAL HIGH (ref 0.00–0.50)

## 2023-03-07 LAB — HIV ANTIBODY (ROUTINE TESTING W REFLEX): HIV Screen 4th Generation wRfx: NONREACTIVE

## 2023-03-07 MED ORDER — ZINC SULFATE 220 (50 ZN) MG PO CAPS
220.0000 mg | ORAL_CAPSULE | Freq: Every day | ORAL | Status: DC
  Administered 2023-03-07 – 2023-03-09 (×3): 220 mg via ORAL
  Filled 2023-03-07 (×3): qty 1

## 2023-03-07 MED ORDER — VITAMIN C 500 MG PO TABS
500.0000 mg | ORAL_TABLET | Freq: Every day | ORAL | Status: DC
  Administered 2023-03-07 – 2023-03-09 (×3): 500 mg via ORAL
  Filled 2023-03-07 (×3): qty 1

## 2023-03-07 MED ORDER — VITAMIN C 500 MG PO TABS: 500.0000 mg | ORAL_TABLET | Freq: Two times a day (BID) | ORAL | Status: DC

## 2023-03-07 NOTE — Progress Notes (Signed)
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 Milus Glazier trouble with cpap recently per OV 02/13/23 [mask/pressures adjusted] Gout  Started feeling ill 4 to 5 days ago and thinks he might of contracted COVID from his nephew who recently returned from a wedding in [place]  Presented to ED hypoxic to 79% with an AKI BUNs/creatinine 28/1.7 with bibasilar airspace disease started on oxygen dexamethasone remdesivir etc.  Plan  Moderate severity COVID CRP 15 dimer 0.9-continue remdesivir at least for 3 days Decadron 6 mg He feels about 30% of his normal and overall better was able to walk in the hallways He needs to prone at least 16 hours a day given his OHSS habitus as well as his underlying asthma If he worsens we will consider Actemra after shared decision making  AKI on admission--he does have labs from 2020 showing creatinine of 1.2 Baseline creatinine 1.9? --Some underlying CKD? Force oral fluids-monitor trends of the creatinine going forward and monitor overnight  OSA Use nasal pillows oxygen at night  Gout clinically quiescent  Prior prostatectomy-outpatient follow-up   DVT prophylaxis: Lovenox  Status is: Inpatient Remains inpatient appropriate because: Requires further management in the outpatient setting    Subjective: Yesterday he felt 10% of his normal-today he feels a little better, feels about 30% Walked around in the hallway?  Does not seem intolerable distress but does have some increased work of breathing with changing position from recumbent to sitting No chest pain  Objective + exam Vitals:   03/07/23 0938 03/07/23 0943 03/07/23 1437 03/07/23 1452  BP: 114/77  114/77 111/68   Pulse: 73 73 73   Resp:  20 20   Temp:  98.3 F (36.8 C) 98 F (36.7 C)   TempSrc:      SpO2:  97% 93% (!) 88%  Weight:      Height:       Filed Weights   03/06/23 1222  Weight: 113.4 kg    Examination:  EOMI NCAT no focal deficit neck thick Mallampati 4-on 2 L of oxygen No wheeze rales rhonchi S1-S2 no murmur Abdomen obese nontender no rebound Trace lower extremity edema   Data Reviewed: reviewed   CBC    Component Value Date/Time   WBC 8.0 03/06/2023 1305   RBC 5.21 03/06/2023 1305   HGB 14.3 03/06/2023 1305   HCT 45.0 03/06/2023 1305   PLT 243 03/06/2023 1305   MCV 86.4 03/06/2023 1305   MCH 27.4 03/06/2023 1305   MCHC 31.8 03/06/2023 1305   RDW 15.3 03/06/2023 1305   LYMPHSABS 1.5 03/06/2023 1305   MONOABS 0.8 03/06/2023 1305   EOSABS 0.0 03/06/2023 1305   BASOSABS 0.0 03/06/2023 1305      Latest Ref Rng & Units 03/07/2023    3:39 AM 03/06/2023    1:55 PM 12/13/2022    2:18 AM  CMP  Glucose 70 - 99 mg/dL 440  102  725   BUN 6 - 20 mg/dL 24  28  15    Creatinine 0.61 - 1.24 mg/dL 3.66  4.40  3.47   Sodium 135 - 145 mmol/L 134  132  137   Potassium 3.5 - 5.1 mmol/L  4.9  4.3  4.0   Chloride 98 - 111 mmol/L 103  102  103   CO2 22 - 32 mmol/L 24  22  26    Calcium 8.9 - 10.3 mg/dL 8.3  8.0  9.2   Total Protein 6.5 - 8.1 g/dL 7.2  7.0    Total Bilirubin 0.3 - 1.2 mg/dL 0.6  0.8    Alkaline Phos 38 - 126 U/L 51  48    AST 15 - 41 U/L 34  34    ALT 0 - 44 U/L 36  36       Scheduled Meds:  ascorbic acid  500 mg Oral Daily   carvedilol  12.5 mg Oral BID   dexamethasone  6 mg Oral Q24H   enoxaparin (LOVENOX) injection  40 mg Subcutaneous Q24H   fluticasone  1 spray Each Nare BH-qamhs   fluticasone furoate-vilanterol  1 puff Inhalation Daily   And   umeclidinium bromide  1 puff Inhalation Daily   irbesartan  300 mg Oral Daily   loratadine  10 mg Oral QHS   pantoprazole  40 mg Oral Daily   rosuvastatin  10 mg Oral Daily   sodium chloride  flush  3 mL Intravenous Q12H   tamsulosin  0.4 mg Oral Daily   zinc sulfate  220 mg Oral Daily   Continuous Infusions:  sodium chloride     remdesivir 100 mg in sodium chloride 0.9 % 100 mL IVPB Stopped (03/07/23 1226)    Time  44  Rhetta Mura, MD  [facility]

## 2023-03-07 NOTE — Plan of Care (Signed)
  Problem: Coping: Goal: Psychosocial and spiritual needs will be supported Outcome: Progressing   Problem: Respiratory: Goal: Will maintain a patent airway Outcome: Progressing   Problem: Education: Goal: Knowledge of General Education information will improve Description: Including pain rating scale, medication(s)/side effects and non-pharmacologic comfort measures Outcome: Progressing   Problem: Activity: Goal: Risk for activity intolerance will decrease Outcome: Progressing   Problem: Pain Managment: Goal: General experience of comfort will improve Outcome: Progressing   Problem: Safety: Goal: Ability to remain free from injury will improve Outcome: Progressing   

## 2023-03-07 NOTE — Plan of Care (Signed)
  Problem: Education: Goal: Knowledge of risk factors and measures for prevention of condition will improve Outcome: Progressing   Problem: Coping: Goal: Psychosocial and spiritual needs will be supported 03/07/2023 0617 by Irineo Axon, RN Outcome: Progressing 03/07/2023 0616 by Irineo Axon, RN Outcome: Progressing   Problem: Respiratory: Goal: Will maintain a patent airway 03/07/2023 0617 by Irineo Axon, RN Outcome: Progressing 03/07/2023 0616 by Irineo Axon, RN Outcome: Progressing   Problem: Education: Goal: Knowledge of General Education information will improve Description: Including pain rating scale, medication(s)/side effects and non-pharmacologic comfort measures Outcome: Progressing   Problem: Activity: Goal: Risk for activity intolerance will decrease 03/07/2023 0617 by Irineo Axon, RN Outcome: Progressing 03/07/2023 0616 by Irineo Axon, RN Outcome: Progressing   Problem: Pain Managment: Goal: General experience of comfort will improve Outcome: Progressing   Problem: Safety: Goal: Ability to remain free from injury will improve 03/07/2023 0617 by Irineo Axon, RN Outcome: Progressing 03/07/2023 0616 by Irineo Axon, RN Outcome: Progressing

## 2023-03-08 ENCOUNTER — Inpatient Hospital Stay (HOSPITAL_COMMUNITY): Payer: BC Managed Care – PPO

## 2023-03-08 DIAGNOSIS — U071 COVID-19: Secondary | ICD-10-CM | POA: Diagnosis not present

## 2023-03-08 DIAGNOSIS — J1282 Pneumonia due to coronavirus disease 2019: Secondary | ICD-10-CM | POA: Diagnosis not present

## 2023-03-08 LAB — CBC WITH DIFFERENTIAL/PLATELET
Abs Immature Granulocytes: 0.05 10*3/uL (ref 0.00–0.07)
Basophils Absolute: 0 10*3/uL (ref 0.0–0.1)
Basophils Relative: 0 %
Eosinophils Absolute: 0 10*3/uL (ref 0.0–0.5)
Eosinophils Relative: 0 %
HCT: 44.3 % (ref 39.0–52.0)
Hemoglobin: 14.2 g/dL (ref 13.0–17.0)
Immature Granulocytes: 0 %
Lymphocytes Relative: 12 %
Lymphs Abs: 1.3 10*3/uL (ref 0.7–4.0)
MCH: 27 pg (ref 26.0–34.0)
MCHC: 32.1 g/dL (ref 30.0–36.0)
MCV: 84.4 fL (ref 80.0–100.0)
Monocytes Absolute: 1 10*3/uL (ref 0.1–1.0)
Monocytes Relative: 9 %
Neutro Abs: 8.9 10*3/uL — ABNORMAL HIGH (ref 1.7–7.7)
Neutrophils Relative %: 79 %
Platelets: 299 10*3/uL (ref 150–400)
RBC: 5.25 MIL/uL (ref 4.22–5.81)
RDW: 14.8 % (ref 11.5–15.5)
WBC: 11.2 10*3/uL — ABNORMAL HIGH (ref 4.0–10.5)
nRBC: 0 % (ref 0.0–0.2)

## 2023-03-08 LAB — BASIC METABOLIC PANEL
Anion gap: 8 (ref 5–15)
BUN: 27 mg/dL — ABNORMAL HIGH (ref 6–20)
CO2: 24 mmol/L (ref 22–32)
Calcium: 8.7 mg/dL — ABNORMAL LOW (ref 8.9–10.3)
Chloride: 101 mmol/L (ref 98–111)
Creatinine, Ser: 1.23 mg/dL (ref 0.61–1.24)
GFR, Estimated: >60 mL/min (ref >60)
Glucose, Bld: 149 mg/dL — ABNORMAL HIGH (ref 70–99)
Potassium: 4.7 mmol/L (ref 3.5–5.1)
Sodium: 133 mmol/L — ABNORMAL LOW (ref 135–145)

## 2023-03-08 LAB — C-REACTIVE PROTEIN: CRP: 8.7 mg/dL — ABNORMAL HIGH (ref <1.0)

## 2023-03-08 MED ORDER — DEXAMETHASONE 6 MG PO TABS
6.0000 mg | ORAL_TABLET | ORAL | Status: DC
  Administered 2023-03-09: 6 mg via ORAL
  Filled 2023-03-08: qty 1

## 2023-03-08 NOTE — Progress Notes (Signed)
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 Milus Glazier trouble with cpap recently per OV 02/13/23 [mask/pressures adjusted] Gout  Started feeling ill 4 to 5 days ago and thinks he might of contracted COVID from his nephew who recently returned from a wedding in [place]  Presented to ED hypoxic to 79% with an AKI BUNs/creatinine 28/1.7 with bibasilar airspace disease started on oxygen dexamethasone remdesivir etc.  Plan  Moderate severity COVID CRP down more--feels some better from resp perspective--finish remdesivir 03/08/23--give total 10 days Decadron 6 mg Reinforced prone at least 16 hours a day given his OHSS habitus / underlying asthma Attempt wean to Room air  AKI on admission--he does have labs from 2020 showing creatinine of 1.2 Baseline creatinine 1.9? -Force oral fluids--improved--hold IVf for now  OSA Use nasal pillows oxygen at night OP titration study  Gout clinically quiescent  Prior prostatectomy-outpatient follow-up   DVT prophylaxis: Lovenox  Status is: Inpatient Remains inpatient appropriate because: Requires further management in the outpatient setting    Subjective: Feels 30-40% his usual self A little dizzy walking in the hallway?   No chest pain sputum fever  Objective + exam Vitals:   03/07/23 2100 03/08/23 0621 03/08/23 0902 03/08/23 1210  BP:  107/73  114/69  Pulse:  64  65  Resp:  18    Temp:  98.6 F (37 C)  98 F (36.7 C)  TempSrc: Oral Oral    SpO2:  93% 94% 97%  Weight:      Height:       Filed Weights   03/06/23 1222  Weight: 113.4 kg    Examination:   thick Mallampati 4-on 3 L of oxygen No wheeze rales rhonchi S1-S2 no  murmur Abdomen obese nontender no rebound Trace lower extremity edema   Data Reviewed: reviewed   CBC    Component Value Date/Time   WBC 11.2 (H) 03/08/2023 0338   RBC 5.25 03/08/2023 0338   HGB 14.2 03/08/2023 0338   HCT 44.3 03/08/2023 0338   PLT 299 03/08/2023 0338   MCV 84.4 03/08/2023 0338   MCH 27.0 03/08/2023 0338   MCHC 32.1 03/08/2023 0338   RDW 14.8 03/08/2023 0338   LYMPHSABS 1.3 03/08/2023 0338   MONOABS 1.0 03/08/2023 0338   EOSABS 0.0 03/08/2023 0338   BASOSABS 0.0 03/08/2023 0338      Latest Ref Rng & Units 03/08/2023    3:38 AM 03/07/2023    3:39 AM 03/06/2023    1:55 PM  CMP  Glucose 70 - 99 mg/dL 811  914  782   BUN 6 - 20 mg/dL 27  24  28    Creatinine 0.61 - 1.24 mg/dL 9.56  2.13  0.86   Sodium 135 - 145 mmol/L 133  134  132   Potassium 3.5 - 5.1 mmol/L 4.7  4.9  4.3   Chloride 98 - 111 mmol/L 101  103  102   CO2 22 - 32 mmol/L 24  24  22    Calcium 8.9 - 10.3 mg/dL 8.7  8.3  8.0   Total Protein 6.5 - 8.1 g/dL  7.2  7.0   Total Bilirubin 0.3 - 1.2 mg/dL  0.6  0.8   Alkaline Phos 38 - 126 U/L  51  48   AST 15 - 41 U/L  34  34   ALT 0 - 44 U/L  36  36      Scheduled Meds:  ascorbic acid  500 mg Oral Daily   carvedilol  12.5 mg Oral BID   dexamethasone  6 mg Oral Q24H   enoxaparin (LOVENOX) injection  40 mg Subcutaneous Q24H   fluticasone  1 spray Each Nare BH-qamhs   fluticasone furoate-vilanterol  1 puff Inhalation Daily   And   umeclidinium bromide  1 puff Inhalation Daily   irbesartan  300 mg Oral Daily   loratadine  10 mg Oral QHS   pantoprazole  40 mg Oral Daily   rosuvastatin  10 mg Oral Daily   sodium chloride flush  3 mL Intravenous Q12H   tamsulosin  0.4 mg Oral Daily   zinc sulfate  220 mg Oral Daily   Continuous Infusions:  sodium chloride      Time  44  Rhetta Mura, MD  [facility]

## 2023-03-08 NOTE — Plan of Care (Signed)

## 2023-03-09 MED ORDER — DEXTROMETHORPHAN HBR 15 MG/5ML PO SYRP: 10.0000 mL | ORAL_SOLUTION | Freq: Four times a day (QID) | ORAL | 0 refills | Status: AC | PRN

## 2023-03-09 MED ORDER — DEXAMETHASONE 6 MG PO TABS: 6.0000 mg | ORAL_TABLET | ORAL | 0 refills | Status: AC

## 2023-03-09 NOTE — Plan of Care (Signed)
  Problem: Education: Goal: Knowledge of risk factors and measures for prevention of condition will improve Outcome: Progressing   Problem: Coping: Goal: Psychosocial and spiritual needs will be supported Outcome: Progressing   Problem: Respiratory: Goal: Will maintain a patent airway Outcome: Progressing   Problem: Health Behavior/Discharge Planning: Goal: Ability to manage health-related needs will improve Outcome: Progressing   Problem: Activity: Goal: Risk for activity intolerance will decrease Outcome: Progressing   Problem: Safety: Goal: Ability to remain free from injury will improve Outcome: Progressing

## 2023-03-09 NOTE — Plan of Care (Signed)
  Problem: Education: Goal: Knowledge of risk factors and measures for prevention of condition will improve Outcome: Adequate for Discharge   Problem: Coping: Goal: Psychosocial and spiritual needs will be supported Outcome: Adequate for Discharge   Problem: Respiratory: Goal: Will maintain a patent airway Outcome: Adequate for Discharge Goal: Complications related to the disease process, condition or treatment will be avoided or minimized Outcome: Adequate for Discharge   Problem: Education: Goal: Knowledge of General Education information will improve Description: Including pain rating scale, medication(s)/side effects and non-pharmacologic comfort measures Outcome: Adequate for Discharge   Problem: Health Behavior/Discharge Planning: Goal: Ability to manage health-related needs will improve Outcome: Adequate for Discharge   Problem: Clinical Measurements: Goal: Ability to maintain clinical measurements within normal limits will improve Outcome: Adequate for Discharge Goal: Will remain free from infection Outcome: Adequate for Discharge Goal: Diagnostic test results will improve Outcome: Adequate for Discharge Goal: Respiratory complications will improve Outcome: Adequate for Discharge Goal: Cardiovascular complication will be avoided Outcome: Adequate for Discharge   Problem: Activity: Goal: Risk for activity intolerance will decrease Outcome: Adequate for Discharge  Patient awake, alert orient x4. Denies pain at rest. Remained on RA. Patient stable for discharge as per MD order. Patient verbalized understanding of discharge instructions. No signs or symptoms of acute distress at the time of discharge.  Problem: Nutrition: Goal: Adequate nutrition will be maintained Outcome: Adequate for Discharge   Problem: Coping: Goal: Level of anxiety will decrease Outcome: Adequate for Discharge   Problem: Elimination: Goal: Will not experience complications related to bowel  motility Outcome: Adequate for Discharge Goal: Will not experience complications related to urinary retention Outcome: Adequate for Discharge   Problem: Pain Managment: Goal: General experience of comfort will improve Outcome: Adequate for Discharge   Problem: Safety: Goal: Ability to remain free from injury will improve Outcome: Adequate for Discharge   Problem: Skin Integrity: Goal: Risk for impaired skin integrity will decrease Outcome: Adequate for Discharge

## 2023-03-09 NOTE — Discharge Summary (Signed)
Physician Discharge Summary  Adam Sampson:[id number] DOB: [birth year] DOA: 03/06/2023  PCP: Tally Joe, MD  Admit date: 03/06/2023 Discharge date: 03/09/2023  Time spent: 26 minutes  Recommendations for Outpatient Follow-up:  Obtain CBC Chem-12 in about 2 to 3 weeks Requires screening chest x-ray in about 3 weeks in the outpatient setting with lung doctor Recommend close monitoring and titration of CPAP with nasal pillows as per Dr. 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 Milus Glazier trouble with cpap recently per OV 02/13/23 [mask/pressures adjusted] Gout   Started feeling ill 4 to 5 days ago and thinks he might of contracted COVID from his nephew who recently returned from a wedding in [place]   Presented to ED hypoxic to 79% with an AKI BUNs/creatinine 28/1.7 with bibasilar airspace disease started on oxygen dexamethasone remdesivir etc.  Hospital Course:  Moderate severity COVID CRP down more--feels some better from resp perspective--finish remdesivir 03/08/23--give total 10 days Decadron 6 mg, adding dextromethorphan in addition for suppression of cough and feels better and close to 80% of his normal Reinforced prone at least 16 hours a day given his OHSS habitus / underlying asthma He was weaned to room air but was  using only nighttime oxygen as above   AKI on admission--he does have labs from 2020 showing creatinine of 1.2 Baseline creatinine 1.9? -Force oral fluids--improved--this improved to the point of his being close to his baseline at discharge and he will need outpatient follow-up of labs in about 10 to 14 days   OSA Use nasal pillows oxygen at night OP titration study-CC Dr. Maple Hudson   Gout clinically quiescent   Prior prostatectomy-outpatient follow-up     Discharge Exam: Vitals:   03/09/23 0417 03/09/23 0856  BP: 139/75   Pulse: 69   Resp: 16   Temp: 98.6 F (37 C)   SpO2: 92% 94%    Subj on day of d/c   Awake coherent no distress thick neck Mallampati 4 EOMI NCAT no focal deficit CTAB no added sound no rales no rhonchi   Discharge Instructions   Discharge Instructions     Diet - low sodium heart healthy   Complete by: As directed    Discharge instructions   Complete by: As directed    Finish the course of decadron.  Make sure you follow up with your lung doctor.  Make sure you get all your med's filled and continue to follow with your pulmonogist for further care and management of your CPAP related issues Use the cough suppressant for continuous couging   Increase activity slowly   Complete by: As directed       Allergies as of 03/09/2023       Reactions   Amlodipine Besylate Swelling, Other (See Comments)   Gums swell        Medication List     STOP taking these  medications    naproxen sodium 220 MG tablet Commonly known as: ALEVE       TAKE these medications    albuterol 108 (90 Base) MCG/ACT inhaler Commonly known as: VENTOLIN HFA Inhale 2 puffs into the lungs every 4 (four) hours as needed for wheezing or shortness of breath.   carvedilol 12.5 MG tablet Commonly known as: COREG Take 12.5 mg by mouth 2 (two) times daily.   dexamethasone 6 MG tablet Commonly known as: DECADRON Take 1 tablet (6 mg total) by mouth daily for 6 days.    dextromethorphan 15 MG/5ML syrup Take 10 mLs (30 mg total) by mouth 4 (four) times daily as needed for cough.   fluticasone 50 MCG/ACT nasal spray Commonly known as: FLONASE Place 1 spray into both nostrils 2 (two) times daily as needed for rhinitis or allergies.   levocetirizine 5 MG tablet Commonly known as: XYZAL Take 1 tablet (5 mg total) by mouth every evening.   olmesartan 40 MG tablet Commonly known as: BENICAR Take 40 mg by mouth daily at 12 noon.   oxyCODONE-acetaminophen 5-325 MG tablet Commonly known as: PERCOCET/ROXICET Take 1 tablet by mouth every 6 (six) hours as needed for severe pain.   Ozempic (0.25 or 0.5 MG/DOSE) 2 MG/3ML Sopn Generic drug: Semaglutide(0.25 or 0.5MG /DOS) Inject 0.25 mg into the skin every Saturday.   pantoprazole 40 MG tablet Commonly known as: PROTONIX Take 40 mg by mouth daily before lunch.   rosuvastatin 10 MG tablet Commonly known as: CRESTOR Take 10 mg by mouth daily at 12 noon.   tamsulosin 0.4 MG Caps capsule Commonly known as: FLOMAX Take 1 capsule (0.4 mg total) by mouth daily.   Trelegy Ellipta 100-62.5-25 MCG/ACT Aepb Generic drug: Fluticasone-Umeclidin-Vilant Inhale 1 puff into the lungs daily.       Allergies  Allergen Reactions   Amlodipine Besylate Swelling and Other (See Comments)    Gums swell    Follow-up Information     Tally Joe, MD.   Specialty: Family Medicine Contact information: [address] [place] [address] [phone]         [facility] Emergency Department at [facility].   Specialty: Emergency Medicine Why: If symptoms worsen Contact information: [address] [id number] [address] [place] [address] [phone]                 The results of significant diagnostics from this hospitalization (including imaging, microbiology, ancillary and laboratory) are listed below for reference.    Significant Diagnostic Studies: DG  Chest 2 View  Result Date: 03/08/2023 CLINICAL DATA:  COVID positive. Feeling bad for 5-6 days with shortness of breath EXAM: CHEST - 2 VIEW COMPARISON:  03/06/23. FINDINGS: Stable cardiomediastinal contours. No pleural fluid or interstitial edema. Interval increase in bilateral upper and lower lobe increase interstitial opacities with diffuse bronchial wall thickening. No lobar consolidation. Visualized osseous structures are unremarkable. IMPRESSION: Interval increase in bilateral upper and lower lobe interstitial opacities with diffuse bronchial wall thickening. Findings are consistent with atypical/viral infection. Electronically Signed   By: Signa Kell M.D.   On: 03/08/2023 13:10   DG Chest Port 1 View  Result Date: 03/06/2023 CLINICAL DATA:  cough EXAM: PORTABLE CHEST - 1 VIEW COMPARISON:  06/11/2020 FINDINGS: Relatively low lung volumes. Patchy infrahilar and bibasilar airspace opacities, new since previous. Heart size upper limits normal. No effusion. Visualized bones unremarkable. IMPRESSION: Patchy infrahilar and bibasilar airspace opacities. Electronically Signed   By: Corlis Leak  M.D.   On: 03/06/2023 13:01    Microbiology: Recent Results (from the past 240 hour(s))  Resp panel by RT-PCR (RSV, Flu A&B, Covid) Anterior Nasal Swab     Status: Abnormal   Collection Time: 03/06/23  1:08 PM   Specimen: Anterior Nasal Swab  Result Value Ref Range Status   SARS Coronavirus 2 by RT PCR POSITIVE (A) NEGATIVE Final    Comment: (NOTE) SARS-CoV-2 target nucleic acids are DETECTED.  The SARS-CoV-2 RNA is generally detectable in upper respiratory specimens during the acute phase of infection. Positive results are indicative of the presence of the identified virus, but do not rule out bacterial infection or co-infection with other pathogens not detected by the test. Clinical correlation with patient history and other diagnostic information is necessary to determine patient infection status. The  expected result is Negative.  Fact Sheet for Patients: BloggerCourse.com  Fact Sheet for Healthcare Providers: SeriousBroker.it  This test is not yet approved or cleared by the [place] FDA and  has been authorized for detection and/or diagnosis of SARS-CoV-2 by FDA under an Emergency Use Authorization (EUA).  This EUA will remain in effect (meaning this test can be used) for the duration of  the COVID-19 declaration under Section 564(b)(1) of the A ct, 21 U.S.C. section 360bbb-3(b)(1), unless the authorization is terminated or revoked sooner.     Influenza A by PCR NEGATIVE NEGATIVE Final   Influenza B by PCR NEGATIVE NEGATIVE Final    Comment: (NOTE) The Xpert Xpress SARS-CoV-2/FLU/RSV plus assay is intended as an aid in the diagnosis of influenza from Nasopharyngeal swab specimens and should not be used as a sole basis for treatment. Nasal washings and aspirates are unacceptable for Xpert Xpress SARS-CoV-2/FLU/RSV testing.  Fact Sheet for Patients: BloggerCourse.com  Fact Sheet for Healthcare Providers: SeriousBroker.it  This test is not yet approved or cleared by the [place] FDA and has been authorized for detection and/or diagnosis of SARS-CoV-2 by FDA under an Emergency Use Authorization (EUA). This EUA will remain in effect (meaning this test can be used) for the duration of the COVID-19 declaration under Section 564(b)(1) of the Act, 21 U.S.C. section 360bbb-3(b)(1), unless the authorization is terminated or revoked.     Resp Syncytial Virus by PCR NEGATIVE NEGATIVE Final    Comment: (NOTE) Fact Sheet for Patients: BloggerCourse.com  Fact Sheet for Healthcare Providers: SeriousBroker.it  This test is not yet approved or cleared by the [place] FDA and has been authorized for detection and/or  diagnosis of SARS-CoV-2 by FDA under an Emergency Use Authorization (EUA). This EUA will remain in effect (meaning this test can be used) for the duration of the COVID-19 declaration under Section 564(b)(1) of the Act, 21 U.S.C. section 360bbb-3(b)(1), unless the authorization is terminated or revoked.  Performed at [facility], [address], [place], [place] [address]      Labs: Basic Metabolic Panel: Recent Labs  Lab 03/06/23 1355 03/07/23 0339 03/08/23 0338  NA 132* 134* 133*  K 4.3 4.9 4.7  CL 102 103 101  CO2 22 24 24   GLUCOSE 100* 166* 149*  BUN 28* 24* 27*  CREATININE 1.72* 1.40* 1.23  CALCIUM 8.0* 8.3* 8.7*  MG 2.3  --   --    Liver Function Tests: Recent Labs  Lab 03/06/23 1355 03/07/23 0339  AST 34 34  ALT 36 36  ALKPHOS 48 51  BILITOT 0.8 0.6  PROT 7.0 7.2  ALBUMIN 3.1* 3.0*   Recent Labs  Lab 03/06/23 1355  LIPASE 33   No results for input(s): "AMMONIA" in the last 168 hours. CBC: Recent Labs  Lab 03/06/23 1305 03/08/23 0338  WBC 8.0 11.2*  NEUTROABS 5.6 8.9*  HGB 14.3 14.2  HCT 45.0 44.3  MCV 86.4 84.4  PLT 243 299   Cardiac Enzymes: No results for input(s): "CKTOTAL", "CKMB", "CKMBINDEX", "TROPONINI" in the last 168 hours. BNP: BNP (last 3 results) No results for input(s): "BNP" in the last 8760 hours.  ProBNP (last 3 results) No results for input(s): "PROBNP" in the last 8760 hours.  CBG: No results for input(s): "GLUCAP" in the last 168 hours.     Signed:  Rhetta Mura MD   [facility] 03/09/2023, 9:16 AM

## 2023-03-09 NOTE — TOC Initial Note (Addendum)
Transition of Care [facility]) - Initial/Assessment Note    Patient Details  Name: Adam Sampson MRN: [id number] Date of Birth: 12/11/1960  [facility]) CM/SW Contact:    Adam Prows, RN Phone Number: 03/09/2023, 9:43 AM  Clinical Narrative:                 Adam Sampson w/ pt; pt says he is from home and plans to return at d/c; he identified POC Adam Sampson (brother) [phone]; he also verified he has PCP and insurance; pt says he ha transportation;  pt denies IPV, food/housing insecurity, difficulty paying utilities; pt says he has been out of work and bills are paid; he declined offer to receive resources; pt says he has glasses and cpap; he does not have HH services or home oxygen; no TOC needs.  Expected Discharge Plan: Home/Self Care Barriers to Discharge: No Barriers Identified   Patient Goals and CMS Choice Patient states their goals for this hospitalization and ongoing recovery are:: home          Expected Discharge Plan and Services   Discharge Planning Services: CM Consult Post Acute Care Choice: NA Living arrangements for the past 2 months: Single Family Home Expected Discharge Date: 03/09/23               DME Arranged: N/A DME Agency: NA       HH Arranged: NA HH Agency: NA        Prior Living Arrangements/Services Living arrangements for the past 2 months: Single Family Home Lives with:: Self Patient language and need for interpreter reviewed:: Yes Do you feel safe going back to the place where you live?: Yes      Need for Family Participation in Patient Care: Yes (Comment) Care giver support system in place?: Yes (comment) Current home services: DME (cpap) Criminal Activity/Legal Involvement Pertinent to Current Situation/Hospitalization: No - Comment as needed  Activities of Daily Living Home Assistive Devices/Equipment: None ADL Screening (condition at time of admission) Patient's cognitive ability adequate to safely complete daily  activities?: Yes Is the patient deaf or have difficulty hearing?: No Does the patient have difficulty seeing, even when wearing glasses/contacts?: No Does the patient have difficulty concentrating, remembering, or making decisions?: No Patient able to express need for assistance with ADLs?: Yes Does the patient have difficulty dressing or bathing?: No Independently performs ADLs?: Yes (appropriate for developmental age) Does the patient have difficulty walking or climbing stairs?: No Weakness of Legs: None Weakness of Arms/Hands: None  Permission Sought/Granted Permission sought to share information with : Case Manager Permission granted to share information with : Yes, Verbal Permission Granted  Share Information with NAME: Case Manager     Permission granted to share info w Relationship: Adam Sampson (brother) [phone]     Emotional Assessment Appearance:: Appears stated age Attitude/Demeanor/Rapport: Gracious Affect (typically observed): Accepting Orientation: : Oriented to Self, Oriented to Place, Oriented to  Time Alcohol / Substance Use: Not Applicable Psych Involvement: No (comment)  Admission diagnosis:  Acute respiratory failure with hypoxia (HCC) [J96.01] Pneumonia due to COVID-19 virus [U07.1, J12.82] COVID-19 [U07.1] Patient Active Problem List   Diagnosis Date Noted   Pneumonia due to COVID-19 virus 03/06/2023   HLD (hyperlipidemia) 03/06/2023   Asthma, chronic 03/06/2023   HTN (hypertension) 03/06/2023   OSA (obstructive sleep apnea) 02/18/2023   Morbid obesity due to excess calories (HCC) 02/18/2023   Prostate cancer (HCC) 10/24/2016   PCP:  Tally Joe, MD Pharmacy:   [facility]  [address] [place], [place] - [address] [place] [address] Phone: [phone] Fax: [phone]     Social Determinants of Health (SDOH) Social History: SDOH Screenings   Food Insecurity: No Food Insecurity (03/09/2023)   Housing: Low Risk  (03/09/2023)  Transportation Needs: No Transportation Needs (03/09/2023)  Utilities: Not At Risk (03/09/2023)  Tobacco Use: Medium Risk (03/06/2023)   SDOH Interventions: Food Insecurity Interventions: Intervention Not Indicated, Inpatient TOC Housing Interventions: Intervention Not Indicated, Inpatient TOC Transportation Interventions: Intervention Not Indicated, Inpatient TOC Utilities Interventions: Intervention Not Indicated, Inpatient TOC   Readmission Risk Interventions     No data to display

## 2023-03-25 NOTE — Progress Notes (Unsigned)
02/13/23- 62 yoM never smoker for sleep evaluation with concern of OSA- having trouble with CPAP Medical problem list includes HTN, GERD, Prostate Cancer, Asthma, Allergic Rhinitis, Gout, Epworth score- Body weight today-  HST- Sleep Quality Report from [facility] 12/27/22- AHI (4%) 61/ hr, desat to 73%, 1 hr 43 min < 88%, Mean 89%,  CPAP machine provided by [employer] 5-20,  AirSense 11; 01/14/23-02/12/23 Compliance 13%, AHI 12.1/ hr    [employer] [phone] manages his CPAP. -----Received cpap machine a month ago was paid for by company he works for. States cpap machine is causing dry cough, sinus drainage- feels like pressure is too much-been adjusted several times He complains that wearing CPAP disrupts his sleep, aggravates his asthma and makes him overheated despite humidifier adjustment and running fans. He is pretty set against using CPAP, and says he slept better and felt beetter without it. Bedtime between 1:00AM and 3:30 AM, short sleep latency, nocturia x several, gets up bet 9:00AM and 10:30 AM. We discussed comfort measures, medical reasons for treating OSA, and treatment alternatives. His diagnostic AHI is higher than we usually expect an oral appliance to help, and he is too heavy for Inspire. He is amenable to trying pressure change to auto 5-15 and nasal pillows.  03/27/23- 62 yoM  never smoker followed for OSA, complicated by  HTN, GERD, Prostate Cancer, Asthma, Allergic Rhinitis, Gout, CPAP Download compliance- Body weight today- Hosp 7/4- Covid infection- needs f/u CXR  ROS-see HPI  + = positive Constitutional:    weight loss, night sweats, fevers, chills, fatigue, lassitude. HEENT:    headaches, difficulty swallowing, tooth/dental problems, sore throat,       sneezing, itching, ear ache, nasal congestion, post nasal drip, snoring CV:    chest pain, orthopnea, PND, swelling in lower extremities, anasarca,                      dizziness, palpitations Resp:    shortness of breath with exertion or at rest.                productive cough,   non-productive cough, coughing up of blood.              change in color of mucus.  wheezing.   Skin:    rash or lesions. GI:  No-   heartburn, indigestion, abdominal pain, nausea, vomiting, diarrhea,                 change in bowel habits, loss of appetite GU: dysuria, change in color of urine, no urgency or frequency.   flank pain. MS:   joint pain, stiffness, decreased range of motion, back pain. Neuro-     nothing unusual Psych:  change in mood or affect.  depression or anxiety.   memory loss.  OBJ- Physical Exam General- Alert, Oriented, Affect-appropriate, Distress- none acute, +obese Skin- rash-none, lesions- none, excoriation- none Lymphadenopathy- none Head- atraumatic            Eyes- Gross vision intact, PERRLA, conjunctivae and secretions clear            Ears- Hearing, canals-normal            Nose- Clear, no-Septal dev, mucus, polyps, erosion, perforation             Throat- Mallampati IV , mucosa clear , drainage- none, tonsils- atrophic, +teeth Neck- flexible , trachea midline, no stridor , thyroid nl, carotid no bruit Chest - symmetrical excursion ,  unlabored           Heart/CV- RRR , no murmur , no gallop  , no rub, nl s1 s2                           - JVD- none , edema- none, stasis changes- none, varices- none           Lung- clear to P&A, wheeze- none, cough- none , dullness-none, rub- none           Chest wall-  Abd-  Br/ Gen/ Rectal- Not done, not indicated Extrem- cyanosis- none, clubbing, none, atrophy- none, strength- nl Neuro- grossly intact to observation

## 2023-03-27 ENCOUNTER — Ambulatory Visit (INDEPENDENT_AMBULATORY_CARE_PROVIDER_SITE_OTHER): Payer: BC Managed Care – PPO | Admitting: Internal Medicine

## 2023-03-27 ENCOUNTER — Encounter: Payer: Self-pay | Admitting: Internal Medicine

## 2023-03-27 VITALS — BP 122/66 | HR 98 | Ht 68.0 in | Wt 254.6 lb

## 2023-03-27 DIAGNOSIS — G4733 Obstructive sleep apnea (adult) (pediatric): Secondary | ICD-10-CM | POA: Diagnosis not present

## 2023-03-27 NOTE — Assessment & Plan Note (Signed)
Benefits from CPAP with improved control of apneas. Plan-continue auto 5-15.  Consider pressure change to 5-10 if necessary.

## 2023-03-27 NOTE — Patient Instructions (Signed)
Please try to use your CPAP at least 4 hours/ night and at least 6 nights/ week.  Even if you don't feel the difference, it is still helping you.  Please call if we can help

## 2023-03-27 NOTE — Assessment & Plan Note (Signed)
Continue to encourage diet exercise

## 2023-05-16 ENCOUNTER — Other Ambulatory Visit: Payer: Self-pay | Admitting: Allergy & Immunology

## 2023-08-14 NOTE — Patient Instructions (Incomplete)
Asthma Continue Trelegy 100-1 puff once a day to prevent cough or wheeze Continue albuterol 2 puffs once every 4 hours as needed for cough or wheeze You may use albuterol 2 puffs 5 to 15 minutes before activity to decrease cough or wheeze  Allergic rhinitis Continue allergen avoidance measures directed toward outdoor mold and dust mite as listed below Continue levocetirizine 5 mg once a day as needed for runny nose or itch Continue Flonase 2 sprays in each nostril once a day as needed for stuffy nose.  In the right nostril, point the applicator out toward the right ear. In the left nostril, point the applicator out toward the left ear Consider saline nasal rinses as needed for nasal symptoms. Use this before any medicated nasal sprays for best result  Obstructive sleep apnea Follow-up with your pulmonary specialist as recommended  Call the clinic if this treatment plan is not working well for you.  Follow up in *** or sooner if needed.   Control of Dust Mite Allergen Dust mites play a major role in allergic asthma and rhinitis. They occur in environments with high humidity wherever human skin is found. Dust mites absorb humidity from the atmosphere (ie, they do not drink) and feed on organic matter (including shed human and animal skin). Dust mites are a microscopic type of insect that you cannot see with the naked eye. High levels of dust mites have been detected from mattresses, pillows, carpets, upholstered furniture, bed covers, clothes, soft toys and any woven material. The principal allergen of the dust mite is found in its feces. A gram of dust may contain 1,000 mites and 250,000 fecal particles. Mite antigen is easily measured in the air during house cleaning activities. Dust mites do not bite and do not cause harm to humans, other than by triggering allergies/asthma.  Ways to decrease your exposure to dust mites in your home:  1. Encase mattresses, box springs and pillows with a  mite-impermeable barrier or cover  2. Wash sheets, blankets and drapes weekly in hot water (130 F) with detergent and dry them in a dryer on the hot setting.  3. Have the room cleaned frequently with a vacuum cleaner and a damp dust-mop. For carpeting or rugs, vacuuming with a vacuum cleaner equipped with a high-efficiency particulate air (HEPA) filter. The dust mite allergic individual should not be in a room which is being cleaned and should wait 1 hour after cleaning before going into the room.  4. Do not sleep on upholstered furniture (eg, couches).  5. If possible removing carpeting, upholstered furniture and drapery from the home is ideal. Horizontal blinds should be eliminated in the rooms where the person spends the most time (bedroom, study, television room). Washable vinyl, roller-type shades are optimal.  6. Remove all non-washable stuffed toys from the bedroom. Wash stuffed toys weekly like sheets and blankets above.  7. Reduce indoor humidity to less than 50%. Inexpensive humidity monitors can be purchased at most hardware stores. Do not use a humidifier as can make the problem worse and are not recommended.  Control of Mold Allergen Mold and fungi can grow on a variety of surfaces provided certain temperature and moisture conditions exist.  Outdoor molds grow on plants, decaying vegetation and soil.  The major outdoor mold, Alternaria and Cladosporium, are found in very high numbers during hot and dry conditions.  Generally, a late Summer - Fall peak is seen for common outdoor fungal spores.  Rain will temporarily lower outdoor mold  spore count, but counts rise rapidly when the rainy period ends.  The most important indoor molds are Aspergillus and Penicillium.  Dark, humid and poorly ventilated basements are ideal sites for mold growth.  The next most common sites of mold growth are the bathroom and the kitchen.  Outdoor [employer] Use air conditioning and keep windows  closed Avoid exposure to decaying vegetation. Avoid leaf raking. Avoid grain handling. Consider wearing a face mask if working in moldy areas.  Indoor Mold Control Maintain humidity below 50%. Clean washable surfaces with 5% bleach solution. Remove sources e.g. Contaminated carpets.

## 2023-08-14 NOTE — Progress Notes (Unsigned)
   [address] [place] [address] Dept: [phone]  FOLLOW UP NOTE  Patient ID: Adam Sampson, male    DOB: July 08, 1961  Age: 62 y.o. MRN: [id number] Date of Office Visit: 08/15/2023  Assessment  Chief Complaint: No chief complaint on file.  HPI Adam Sampson is a 62 year old male who presents to the clinic for follow-up visit.  He was last seen in this clinic on 02/13/2023 by Dr. Dellis Anes for evaluation of asthma, allergic rhinitis, and obstructive sleep apnea.  His last environmental allergy skin testing was on 06/15/2021 and was positive to outdoor mold and dust mite.  Discussed the use of AI scribe software for clinical note transcription with the patient, who gave verbal consent to proceed.  History of Present Illness             Drug Allergies:  Allergies  Allergen Reactions   Amlodipine Besylate Swelling and Other (See Comments)    Gums swell    Physical Exam: There were no vitals taken for this visit.   Physical Exam  Diagnostics:    Assessment and Plan: No diagnosis found.  No orders of the defined types were placed in this encounter.   There are no Patient Instructions on file for this visit.  No follow-ups on file.    Thank you for the opportunity to care for this patient.  Please do not hesitate to contact me with questions.  Thermon Leyland, FNP [facility]

## 2023-08-15 ENCOUNTER — Ambulatory Visit: Payer: BC Managed Care – PPO | Admitting: Family Medicine

## 2023-09-15 ENCOUNTER — Other Ambulatory Visit: Payer: Self-pay | Admitting: Allergy & Immunology

## 2023-09-24 NOTE — Progress Notes (Deleted)
 HPI M  never smoker followed for OSA, complicated by  HTN, GERD, Prostate Cancer, Asthma, Allergic Rhinitis, Gout, HST- Sleep Quality Report from [facility] 12/27/22- AHI (4%) 61/ hr, desat to 73%, 1 hr 43 min < 88%, Mean 89%,   =================================================================  02/13/23- 62 yoM never smoker for sleep evaluation with concern of OSA- having trouble with CPAP Medical problem list includes HTN, GERD, Prostate Cancer, Asthma, Allergic Rhinitis, Gout, Epworth score- Body weight today-  HST- Sleep Quality Report from [facility] 12/27/22- AHI (4%) 61/ hr, desat to 73%, 1 hr 43 min < 88%, Mean 89%,  CPAP machine provided by [employer] 5-20,  AirSense 11; 01/14/23-02/12/23 Compliance 13%, AHI 12.1/ hr    REMS[employer] [phone] manages his CPAP. -----Received cpap machine a month ago was paid for by company he works for. States cpap machine is causing dry cough, sinus drainage- feels like pressure is too much-been adjusted several times He complains that wearing CPAP disrupts his sleep, aggravates his asthma and makes him overheated despite humidifier adjustment and running fans. He is pretty set against using CPAP, and says he slept better and felt beetter without it. Bedtime between 1:00AM and 3:30 AM, short sleep latency, nocturia x several, gets up bet 9:00AM and 10:30 AM. We discussed comfort measures, medical reasons for treating OSA, and treatment alternatives. His diagnostic AHI is higher than we usually expect an oral appliance to help, and he is too heavy for Inspire. He is amenable to trying pressure change to auto 5-15 and nasal pillows.  03/27/23- 62 yoM  never smoker followed for OSA, complicated by  HTN, GERD, Prostate Cancer, Asthma, Allergic Rhinitis, Gout, CPAP auto 5-15/ [employer] AirSense 11 AutoSet    new May, 2024  Download compliance- 63%, AHI 0.4/ hr    Body weight today-254 lbs Hosp 7/4- Covid infection- needs f/u CXR -----Patient  states he can't tell much different with new cpap settings  Likes new mask better.  Download reviewed.  Goals reinforced.  CPAP use interrupted when he was in the hospital with COVID.  He says he cannot tell that he sleeps any better with CPAP, but he denied noticing daytime tiredness before he started treatment.  I showed him download indicates there is significant improvement in apnea events wearing CPAP.  He is going to continue. He reports waking a couple of times with sharp substernal pain, transient.  We discussed possibility of air swallowing.  He will watch this for recurrence and report to his primary physician.  There is room to turn down his pressure if necessary.  09/26/23-  62 yoM never smoker followed for OSA, complicated by HTN, GERD, Prostate Cancer, Asthma, Allergic Rhinitis, Gout,  HST- Sleep Quality Report from [facility] 12/27/22- AHI (4%) 61/ hr, desat to 73%, 1 hr 43 min < 88%, Mean 89%,  -ventolin hfa, Trelegy 100,  CPAP machine provided by [employer] 5-20,  AirSense 11;  [employer] [phone] manages his CPAP. Body weight today- Download compliance-    CXR 03/08/23 (Covid infection) IMPRESSION: Interval increase in bilateral upper and lower lobe interstitial opacities with diffuse bronchial wall thickening. Findings are consistent with atypical/viral infection.   ROS-see HPI  + = positive Constitutional:    weight loss, night sweats, fevers, chills, fatigue, lassitude. HEENT:    headaches, difficulty swallowing, tooth/dental problems, sore throat,       sneezing, itching, ear ache, nasal congestion, post nasal drip, snoring CV:    chest pain, orthopnea, PND, swelling in lower extremities, anasarca,  dizziness, palpitations Resp:   shortness of breath with exertion or at rest.                productive cough,   non-productive cough, coughing up of blood.              change in color of mucus.  wheezing.   Skin:    rash or lesions. GI:  No-    heartburn, indigestion, abdominal pain, nausea, vomiting, diarrhea,                 change in bowel habits, loss of appetite GU: dysuria, change in color of urine, no urgency or frequency.   flank pain. MS:   joint pain, stiffness, decreased range of motion, back pain. Neuro-     nothing unusual Psych:  change in mood or affect.  depression or anxiety.   memory loss.  OBJ- Physical Exam General- Alert, Oriented, Affect-appropriate, Distress- none acute, +obese Skin- rash-none, lesions- none, excoriation- none Lymphadenopathy- none Head- atraumatic            Eyes- Gross vision intact, PERRLA, conjunctivae and secretions clear            Ears- Hearing, canals-normal            Nose- Clear, no-Septal dev, mucus, polyps, erosion, perforation             Throat- Mallampati IV , mucosa clear , drainage- none, tonsils- atrophic, +teeth Neck- flexible , trachea midline, no stridor , thyroid nl, carotid no bruit Chest - symmetrical excursion , unlabored           Heart/CV- RRR , no murmur , no gallop  , no rub, nl s1 s2                           - JVD- none , edema- none, stasis changes- none, varices- none           Lung- clear to P&A, wheeze- none, cough- none , dullness-none, rub- none           Chest wall-  Abd-  Br/ Gen/ Rectal- Not done, not indicated Extrem- cyanosis- none, clubbing, none, atrophy- none, strength- nl Neuro- grossly intact to observation

## 2023-09-26 ENCOUNTER — Ambulatory Visit: Payer: BC Managed Care – PPO | Admitting: Internal Medicine

## 2024-02-11 ENCOUNTER — Encounter: Payer: Self-pay | Admitting: Neurology

## 2024-02-12 ENCOUNTER — Other Ambulatory Visit: Payer: Self-pay

## 2024-02-12 DIAGNOSIS — R202 Paresthesia of skin: Secondary | ICD-10-CM

## 2024-03-19 ENCOUNTER — Ambulatory Visit (INDEPENDENT_AMBULATORY_CARE_PROVIDER_SITE_OTHER): Admitting: Neurology

## 2024-03-19 DIAGNOSIS — R202 Paresthesia of skin: Secondary | ICD-10-CM | POA: Diagnosis not present

## 2024-03-19 DIAGNOSIS — G5603 Carpal tunnel syndrome, bilateral upper limbs: Secondary | ICD-10-CM

## 2024-03-19 NOTE — Procedures (Signed)
 [facility]  [address], [address]  [place], [place] [address] Tel: [phone] Fax: [phone] Test Date:  03/19/2024  Patient: Adam Sampson DOB: 02-21-1961 Physician: Tonita Blanch, DO  Sex: Male Height: 5' 8 Ref Phys: Hargis Catchings, MD  ID#: [id number]   Technician:    History: This is a 63 year old man referred for evaluation of bilateral hand paresthesias and pain.  NCV & EMG Findings: Extensive electrodiagnostic testing of the right upper extremity and additional studies of the left shows:  Bilateral median sensory responses are absent.  Bilateral ulnar sensory responses are within normal limits. Bilateral median motor responses show prolonged latency (R5.8, L5.4 ms).  Bilateral ulnar motor responses are within normal limits.   There is no evidence of active or chronic motor axonal loss changes affecting any of the tested muscles.  Motor unit configuration and recruitment pattern is within normal limits.    Impression: Bilateral median neuropathy at or distal to the wrist, consistent with a clinical diagnosis of carpal tunnel syndrome.  Overall, these findings are severe in degree electrically.   ___________________________ Tonita Blanch, DO    Nerve Conduction Studies   Stim Site NR Peak (ms) Norm Peak (ms) O-P Amp (V) Norm O-P Amp  Left Median Anti Sensory (2nd Digit)  32 C  Wrist *NR  <3.8  >10  Right Median Anti Sensory (2nd Digit)  32 C  Wrist *NR  <3.8  >10  Left Ulnar Anti Sensory (5th Digit)  32 C  Wrist    2.6 <3.2 20.2 >5  Right Ulnar Anti Sensory (5th Digit)  32 C  Site 2    2.8  19.3      Stim Site NR Onset (ms) Norm Onset (ms) O-P Amp (mV) Norm O-P Amp Site1 Site2 Delta-0 (ms) Dist (cm) Vel (m/s) Norm Vel (m/s)  Left Median Motor (Abd Poll Brev)  32 C  Wrist    *5.4 <4.0 10.5 >5 Elbow Wrist 5.1 32.0 63 >50  Elbow    10.5  10.0         Right Median Motor (Abd Poll Brev)  32 C  Wrist    *5.8 <4.0 9.3 >5 Elbow Wrist 5.5 29.0 53  >50  Elbow    11.3  8.9         Left Ulnar Motor (Abd Dig Minimi)  32 C  Wrist    2.4 <3.1 9.6 >7 B Elbow Wrist 4.1 23.0 56 >50  B Elbow    6.5  9.3  A Elbow B Elbow 2.0 10.0 50 >50  A Elbow    8.5  8.9         Right Ulnar Motor (Abd Dig Minimi)  32 C  Wrist    2.5 <3.1 11.6 >7 B Elbow Wrist 3.8 22.0 58 >50  B Elbow    6.3  11.0  A Elbow B Elbow 1.7 10.0 59 >50  A Elbow    8.0  10.8          Electromyography   Side Muscle Ins.Act Fibs Fasc Recrt Amp Dur Poly Activation Comment  Left 1stDorInt Nml Nml Nml Nml Nml Nml Nml Nml N/A  Left Abd Poll Brev Nml Nml Nml Nml Nml Nml Nml Nml N/A  Left PronatorTeres Nml Nml Nml Nml Nml Nml Nml Nml N/A  Left Biceps Nml Nml Nml Nml Nml Nml Nml Nml N/A  Left Triceps Nml Nml Nml Nml Nml Nml Nml Nml N/A  Left Deltoid Nml Nml Nml  Nml Nml Nml Nml Nml N/A  Right 1stDorInt Nml Nml Nml Nml Nml Nml Nml Nml N/A  Right Abd Poll Brev Nml Nml Nml Nml Nml Nml Nml Nml N/A  Right PronatorTeres Nml Nml Nml Nml Nml Nml Nml Nml N/A  Right Biceps Nml Nml Nml Nml Nml Nml Nml Nml N/A  Right Triceps Nml Nml Nml Nml Nml Nml Nml Nml N/A  Right Deltoid Nml Nml Nml Nml Nml Nml Nml Nml N/A      Waveforms:

## 2024-04-03 ENCOUNTER — Other Ambulatory Visit: Payer: Self-pay | Admitting: Allergy & Immunology

## 2024-10-14 ENCOUNTER — Encounter: Admitting: Pulmonary Disease

## 2024-10-21 ENCOUNTER — Encounter: Admitting: Pulmonary Disease
